# Patient Record
Sex: Male | Born: 1970 | Race: White | Hispanic: No | Marital: Married | Smoking: Never smoker
Health system: Southern US, Community
[De-identification: ages and names within clinical notes are randomized; demographics above are authoritative.]

## PROBLEM LIST (undated history)

## (undated) DIAGNOSIS — M1990 Unspecified osteoarthritis, unspecified site: Secondary | ICD-10-CM

## (undated) DIAGNOSIS — M722 Plantar fascial fibromatosis: Secondary | ICD-10-CM

## (undated) DIAGNOSIS — I10 Essential (primary) hypertension: Secondary | ICD-10-CM

## (undated) DIAGNOSIS — R7303 Prediabetes: Secondary | ICD-10-CM

## (undated) DIAGNOSIS — G709 Myoneural disorder, unspecified: Secondary | ICD-10-CM

## (undated) HISTORY — PX: HIP SURGERY: SHX245

## (undated) HISTORY — PX: COLONOSCOPY: SHX174

## (undated) HISTORY — PX: WISDOM TOOTH EXTRACTION: SHX21

## (undated) HISTORY — DX: Plantar fascial fibromatosis: M72.2

## (undated) HISTORY — PX: CARPAL TUNNEL RELEASE: SHX101

## (undated) HISTORY — PX: SHOULDER DEBRIDEMENT: SHX1052

---

## 2009-05-12 HISTORY — PX: MECKEL DIVERTICULUM EXCISION: SHX314

## 2009-06-08 ENCOUNTER — Ambulatory Visit: Payer: Self-pay | Admitting: Internal Medicine

## 2009-07-09 ENCOUNTER — Ambulatory Visit: Payer: Self-pay | Admitting: Gastroenterology

## 2009-07-13 ENCOUNTER — Ambulatory Visit: Payer: Self-pay | Admitting: Surgery

## 2009-07-17 ENCOUNTER — Ambulatory Visit: Payer: Self-pay | Admitting: Surgery

## 2009-09-21 LAB — HM COLONOSCOPY: HM Colonoscopy: NORMAL

## 2010-05-23 ENCOUNTER — Ambulatory Visit: Payer: Self-pay | Admitting: Dermatology

## 2010-05-24 ENCOUNTER — Ambulatory Visit: Payer: Self-pay | Admitting: Dermatology

## 2011-03-19 IMAGING — CT CT ABD-PELV W/ CM
1 of 2 series · 15 of 32 positions shown, 19 images · non-contrast
Comparison: none

REASON FOR EXAM: LUQ abdominal pain
COMMENTS:

[Series 2: abdomen · axial · 0.79mm/px · z∈[+310,+800]mm · 15 of 108 slices shown, 19 images]
[im 5/108  soft-tissue]
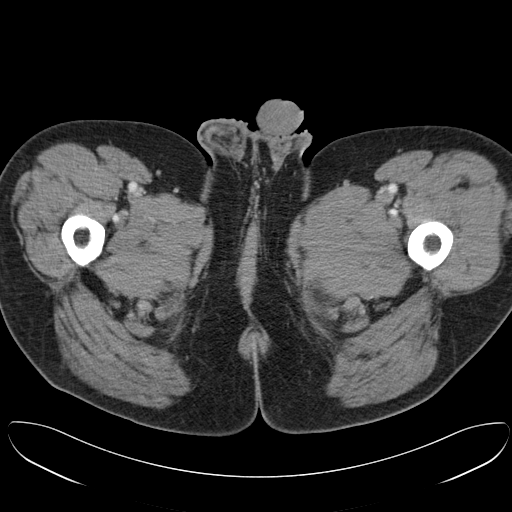
[im 5/108  bone]
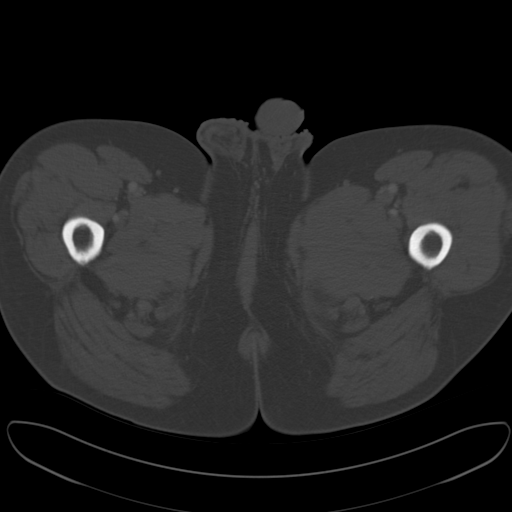
[im 14/108  soft-tissue]
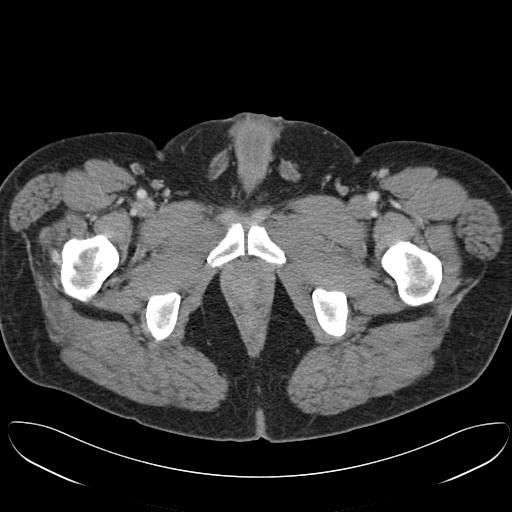
[im 23/108  soft-tissue]
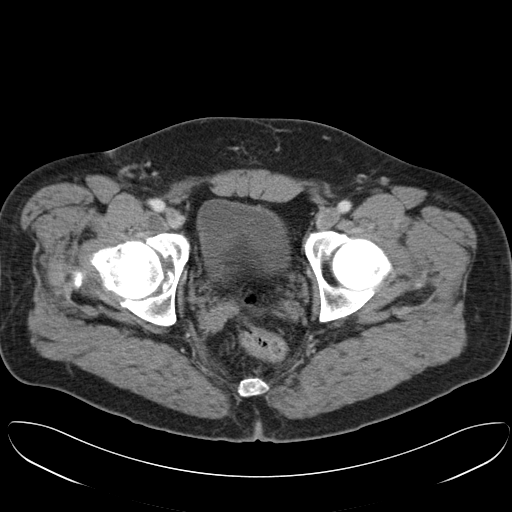
[im 32/108  soft-tissue]
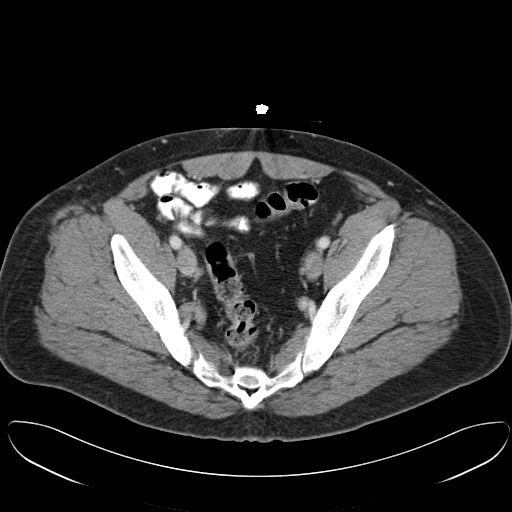
[im 36/108  soft-tissue]
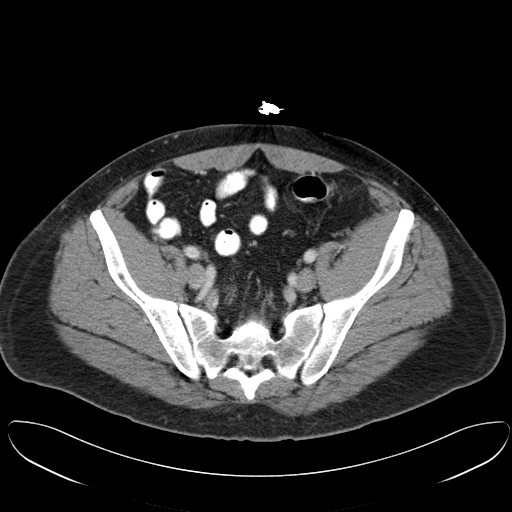
[im 45/108  soft-tissue]
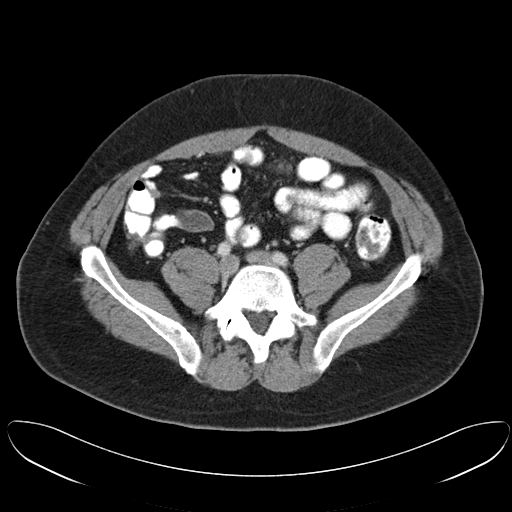
[im 54/108  soft-tissue]
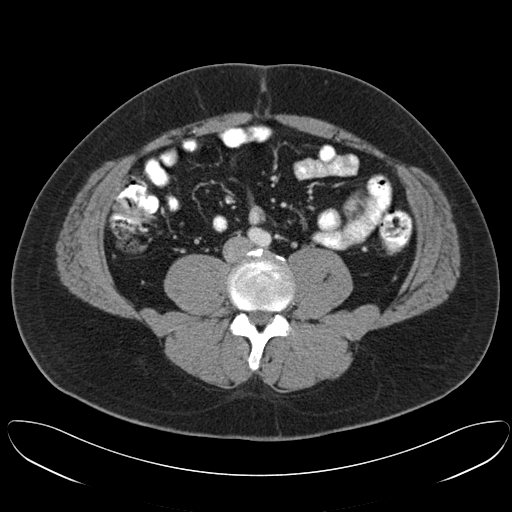
[im 63/108  soft-tissue]
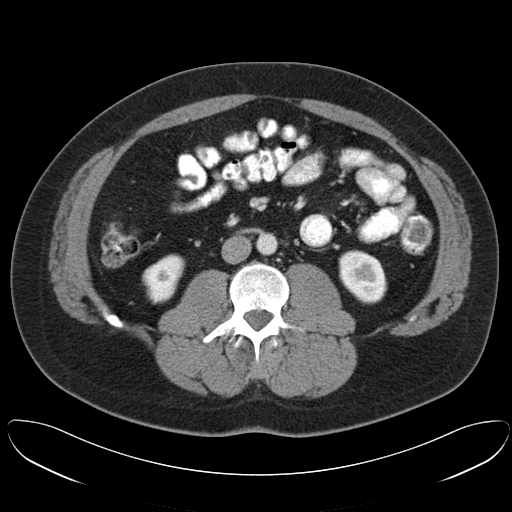
[im 72/108  soft-tissue]
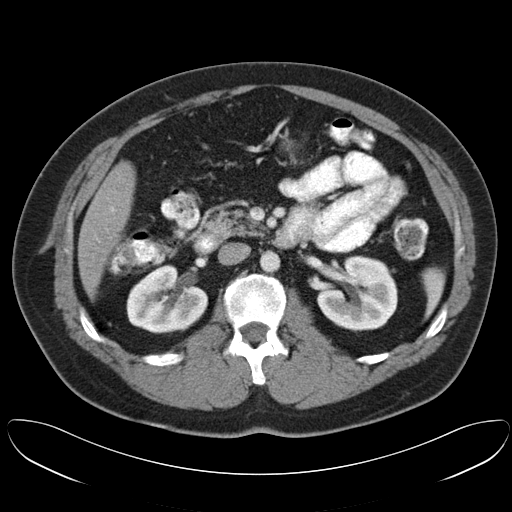
[im 72/108  bone]
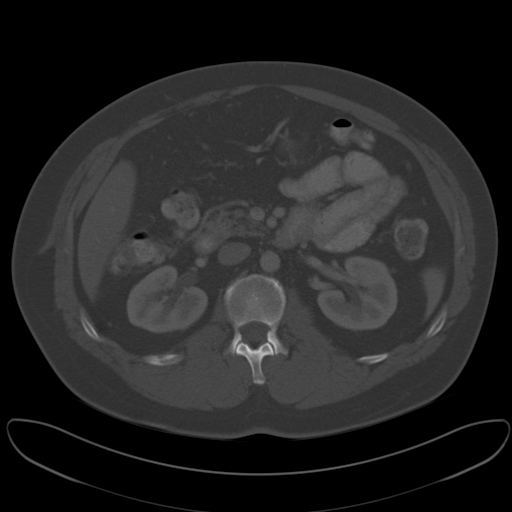
[im 76/108  soft-tissue]
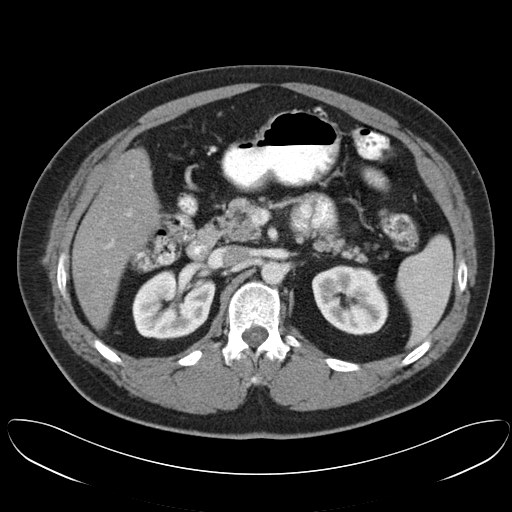
[im 85/108  soft-tissue]
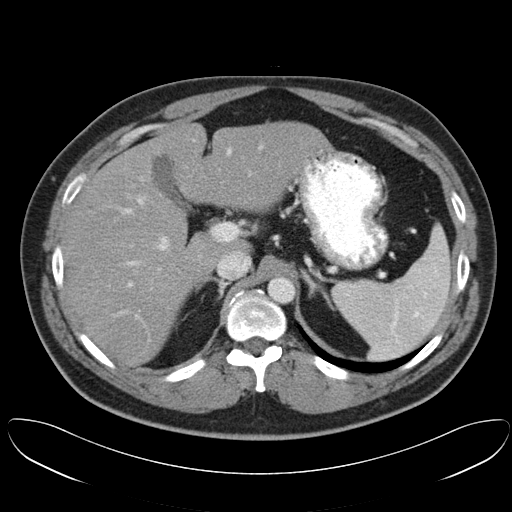
[im 90/108  lung]
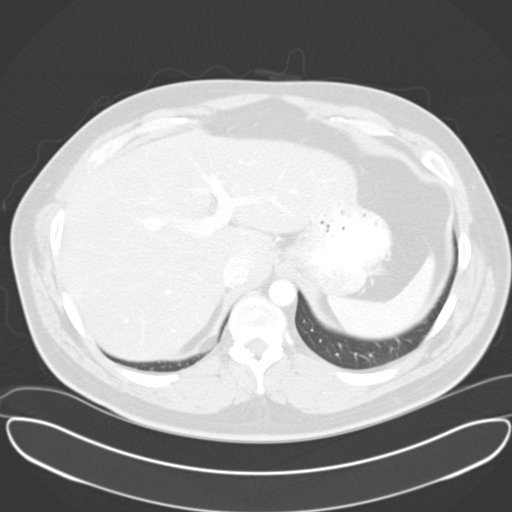
[im 94/108  soft-tissue]
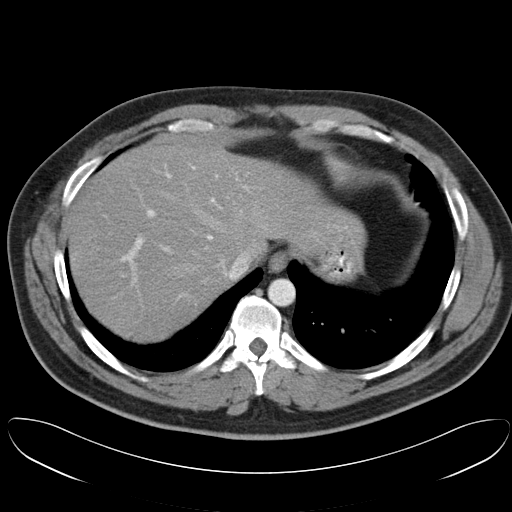
[im 94/108  lung]
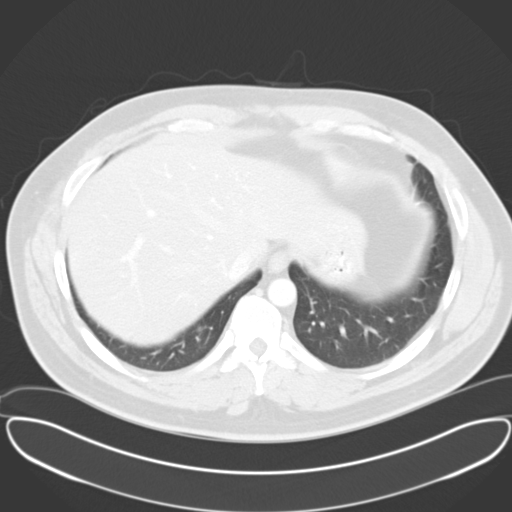
[im 99/108  lung]
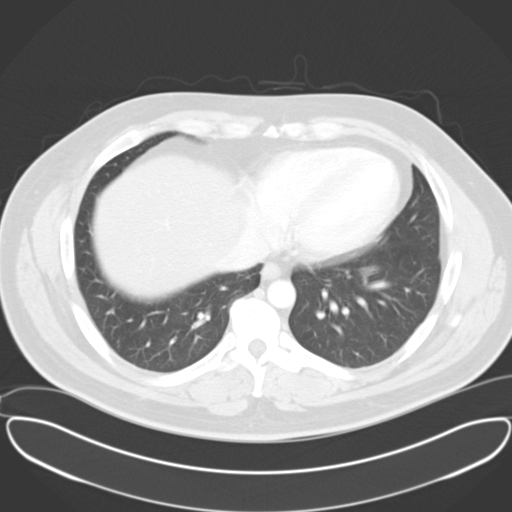
[im 103/108  soft-tissue]
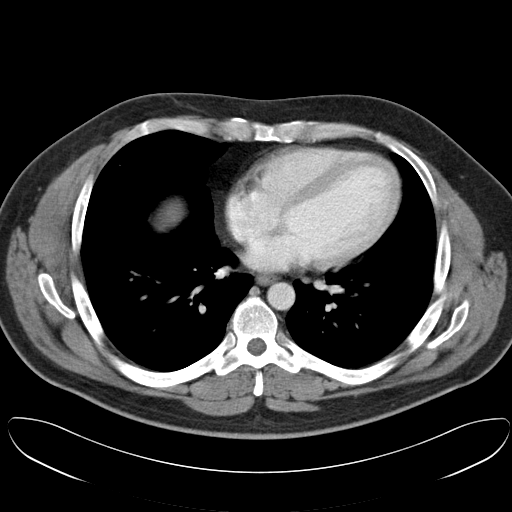
[im 103/108  lung]
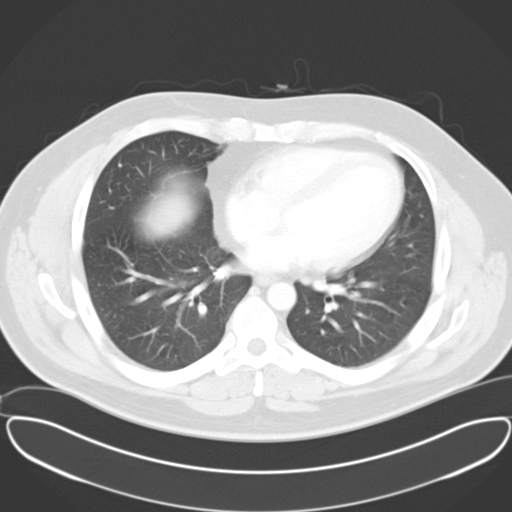

[15 of 32 positions shown; findings below may reference images not displayed]

PROCEDURE:     CT  - CT ABDOMEN / PELVIS  W  - June 08, 2009 [DATE]

RESULT:     CT of the abdomen and pelvis is performed utilizing 100 ml of
Psovue-I1N iodinated intravenous contrast. Images are reconstructed in the
axial plane at 5 mm slice thickness. There is no previous study for
comparison.

Images through the lung bases demonstrate normal aeration. Fatty
infiltration is seen diffusely in the liver. The spleen is normal in
appearance. A small accessory spleen is present. The aorta is normal in
caliber. No acute inflammation is evident. There is no evidence of bowel
wall thickening or abnormal bowel distention. The kidneys enhance
homogeneously. The gallbladder shows no stones or wall thickening. The
adrenal glands are normal. There is no adenopathy. The prostate and urinary
bladder appear unremarkable.

The appendix appears abnormal. The distal portion appears to be distended to
a diameter of 1.7 cm. Along the anterior proximal portion there appears to
be thickening of the wall or possibly a mass. Underlying malignancy is not
excluded. Correlate clinically. Surgical consultation is suggested.
IMPRESSION: 1. Abnormal appearance of the appendix. There appears to be either wall
thickening or mass at the base with a distended appearance of the distal
portion. There is no thickening of the wall of the appendix distally or
evidence of periappendiceal inflammatory stranding to suggest an acute
appendicitis.
2. Fatty infiltration of the liver.
3. Accessory splenic tissue is noted. There is no finding to suggest
constipation or other area of bowel abnormality.

## 2011-08-06 ENCOUNTER — Other Ambulatory Visit: Payer: Self-pay | Admitting: Internal Medicine

## 2011-08-06 MED ORDER — MELOXICAM 15 MG PO TABS: 15.0000 mg | ORAL_TABLET | Freq: Every day | ORAL | Status: DC

## 2011-08-06 NOTE — Telephone Encounter (Signed)
Patient has not been seen in the office.  Please advise.

## 2011-08-06 NOTE — Telephone Encounter (Signed)
If we do not even have records from [facility] on him.  He will need to wait until he is seen or has records transferrred before I can refill for more than 30 days ,. Ok to give #30

## 2011-09-08 ENCOUNTER — Other Ambulatory Visit: Payer: Self-pay | Admitting: Internal Medicine

## 2011-09-22 ENCOUNTER — Ambulatory Visit (INDEPENDENT_AMBULATORY_CARE_PROVIDER_SITE_OTHER): Payer: BC Managed Care – PPO | Admitting: Internal Medicine

## 2011-09-22 ENCOUNTER — Encounter: Payer: Self-pay | Admitting: Internal Medicine

## 2011-09-22 VITALS — BP 148/88 | HR 87 | Temp 97.7°F | Resp 16 | Ht 69.0 in | Wt 231.5 lb

## 2011-09-22 DIAGNOSIS — E669 Obesity, unspecified: Secondary | ICD-10-CM

## 2011-09-22 DIAGNOSIS — R635 Abnormal weight gain: Secondary | ICD-10-CM

## 2011-09-22 DIAGNOSIS — Z13220 Encounter for screening for lipoid disorders: Secondary | ICD-10-CM

## 2011-09-22 DIAGNOSIS — G5603 Carpal tunnel syndrome, bilateral upper limbs: Secondary | ICD-10-CM

## 2011-09-22 DIAGNOSIS — G5600 Carpal tunnel syndrome, unspecified upper limb: Secondary | ICD-10-CM

## 2011-09-22 DIAGNOSIS — Z79899 Other long term (current) drug therapy: Secondary | ICD-10-CM

## 2011-09-22 DIAGNOSIS — M722 Plantar fascial fibromatosis: Secondary | ICD-10-CM

## 2011-09-22 DIAGNOSIS — M1990 Unspecified osteoarthritis, unspecified site: Secondary | ICD-10-CM

## 2011-09-22 DIAGNOSIS — R030 Elevated blood-pressure reading, without diagnosis of hypertension: Secondary | ICD-10-CM

## 2011-09-22 DIAGNOSIS — M129 Arthropathy, unspecified: Secondary | ICD-10-CM

## 2011-09-22 MED ORDER — CELECOXIB 200 MG PO CAPS: 200.0000 mg | ORAL_CAPSULE | Freq: Two times a day (BID) | ORAL | Status: AC

## 2011-09-22 NOTE — Progress Notes (Signed)
Patient ID: Michael Taylor, male   DOB: Sep 24, 1970, 41 y.o.   MRN: [id number] Patient Active Problem List  Diagnoses  . Plantar fasciitis, bilateral  . Elevated blood pressure reading without diagnosis of hypertension  . Obesity (BMI 30-39.9)    Subjective:  CC:   Chief Complaint  Patient presents with  . Follow-up    HPI:   Michael Taylor is a 41 y.o. male who presents followup on arthralgias.  He was last seen over one year ago.  At that time he was reporting bilateral hand numbness which was occurring without neck pain and arm pain.  Was treated with NSAID and wrist braces and symptoms resolved.  Has been using meloxicam for the past year, until his rx wasnot refilled due to loss of follow up.  His Cc today is plantar fasciitis.  He has had bilateral foot pain which was controlled by the meloxicam until about 3 weeks ago when he ran out.  He has multiple other joint complaints intermittently, and recently had  a steroid shot by Reita Chard for rigth wrist pain ,  in the anatomic snuff box. The injection imporved his pain from tendoniti for about a month but was very apinful so he is not plannign to return for a second,  He has no history of GI issues with daily melioxicam.    Past Medical History  Diagnosis Date  . Plantar fasciitis, bilateral     right greater that left,  diagnosed by Dr.  Karl Bales    Past Surgical History  Procedure Date  . Meckel diverticulum excision 2011    Natale Lay  . Shoulder debridement     [facility]  . Hip surgery     right,  [facility]         The following portions of the patient's history were reviewed and updated as appropriate: Allergies, current medications, and problem list.    Review of Systems:   12 Pt  review of systems was negative except those addressed in the HPI,     History   Social History  . Marital Status: Married    Spouse Name: N/A    Number of Children: 3  . Years of Education: N/A    Occupational History  . attorney    Social History Main Topics  . Smoking status: Never Smoker   . Smokeless tobacco: Never Used  . Alcohol Use: 2.4 oz/week    4 Shots of liquor per week     rare  . Drug Use: No  . Sexually Active: Not on file   Other Topics Concern  . Not on file   Social History Narrative  . No narrative on file    Objective:  BP 148/88  Pulse 87  Temp(Src) 97.7 F (36.5 C) (Oral)  Resp 16  Ht 5\' 9"  (1.753 m)  Wt 231 lb 8 oz (105.008 kg)  BMI 34.19 kg/m2  SpO2 95%  General appearance: alert, cooperative and appears stated age Ears: normal TM's and external ear canals both ears Throat: lips, mucosa, and tongue normal; teeth and gums normal Neck: no adenopathy, no carotid bruit, supple, symmetrical, trachea midline and thyroid not enlarged, symmetric, no tenderness/mass/nodules Back: symmetric, no curvature. ROM normal. No CVA tenderness. Lungs: clear to auscultation bilaterally Heart: regular rate and rhythm, S1, S2 normal, no murmur, click, rub or gallop Abdomen: soft, non-tender; bowel sounds normal; no masses,  no organomegaly Pulses: 2+ and symmetric Skin: Skin color, texture, turgor normal. No  rashes or lesions Lymph nodes: Cervical, supraclavicular, and axillary nodes normal.  Assessment and Plan:  Elevated blood pressure reading without diagnosis of hypertension No prior history.  Will have him repet bp checks at home priro to next visit and start therapy if indicated.   Plantar fasciitis, bilateral Trial of celebrex , samples given.   Obesity (BMI 30-39.9) I have addressed  BMI and recommended a low glycemic index diet utilizing smaller more frequent meals to increase metabolism.  I have also recommended that patient start exercising with a goal of 30 minutes of aerobic exercise a minimum of 5 days per week. Screening for lipid disorders, thyroid and diabetes to be done today.     Carpal tunnel syndrome on both sides Symptoms are  mild and do not , by history,  Suggest cervical spine disease as cause. No prior imaging.  Continue NSAIDs for now pending reevaluation of blood pressure,    Updated Medication List Outpatient Encounter Prescriptions as of 09/22/2011  Medication Sig Dispense Refill  . loratadine (CLARITIN) 10 MG tablet Take 10 mg by mouth daily.      . meloxicam (MOBIC) 15 MG tablet Take 1 tablet (15 mg total) by mouth daily.  30 tablet  0  . omeprazole (PRILOSEC) 20 MG capsule Take 20 mg by mouth daily.      . celecoxib (CELEBREX) 200 MG capsule Take 1 capsule (200 mg total) by mouth 2 (two) times daily.  30 capsule  1

## 2011-09-23 ENCOUNTER — Encounter: Payer: Self-pay | Admitting: Internal Medicine

## 2011-09-23 DIAGNOSIS — G5603 Carpal tunnel syndrome, bilateral upper limbs: Secondary | ICD-10-CM | POA: Insufficient documentation

## 2011-09-23 DIAGNOSIS — E669 Obesity, unspecified: Secondary | ICD-10-CM | POA: Insufficient documentation

## 2011-09-23 NOTE — Assessment & Plan Note (Signed)
Symptoms are mild and do not , by history,  Suggest cervical spine disease as cause. No prior imaging.  Continue NSAIDs for now pending reevaluation of blood pressure,

## 2011-09-23 NOTE — Assessment & Plan Note (Signed)
I have addressed  BMI and recommended a low glycemic index diet utilizing smaller more frequent meals to increase metabolism.  I have also recommended that patient start exercising with a goal of 30 minutes of aerobic exercise a minimum of 5 days per week. Screening for lipid disorders, thyroid and diabetes to be done today.   

## 2011-09-23 NOTE — Assessment & Plan Note (Signed)
Trial of celebrex , samples given.

## 2011-09-23 NOTE — Assessment & Plan Note (Signed)
No prior history.  Will have him repet bp checks at home priro to next visit and start therapy if indicated.

## 2011-10-03 ENCOUNTER — Other Ambulatory Visit (INDEPENDENT_AMBULATORY_CARE_PROVIDER_SITE_OTHER): Payer: BC Managed Care – PPO

## 2011-10-03 DIAGNOSIS — M722 Plantar fascial fibromatosis: Secondary | ICD-10-CM

## 2011-10-03 DIAGNOSIS — E669 Obesity, unspecified: Secondary | ICD-10-CM

## 2011-10-03 DIAGNOSIS — R030 Elevated blood-pressure reading, without diagnosis of hypertension: Secondary | ICD-10-CM

## 2011-10-03 DIAGNOSIS — R635 Abnormal weight gain: Secondary | ICD-10-CM

## 2011-10-03 DIAGNOSIS — G5600 Carpal tunnel syndrome, unspecified upper limb: Secondary | ICD-10-CM

## 2011-10-03 DIAGNOSIS — M1990 Unspecified osteoarthritis, unspecified site: Secondary | ICD-10-CM

## 2011-10-03 DIAGNOSIS — Z79899 Other long term (current) drug therapy: Secondary | ICD-10-CM

## 2011-10-03 DIAGNOSIS — G5603 Carpal tunnel syndrome, bilateral upper limbs: Secondary | ICD-10-CM

## 2011-10-03 DIAGNOSIS — M129 Arthropathy, unspecified: Secondary | ICD-10-CM

## 2011-10-03 DIAGNOSIS — Z13220 Encounter for screening for lipoid disorders: Secondary | ICD-10-CM

## 2011-10-03 LAB — COMPREHENSIVE METABOLIC PANEL
CO2: 30 mEq/L (ref 19–32)
Calcium: 8.9 mg/dL (ref 8.4–10.5)
Chloride: 105 mEq/L (ref 96–112)
GFR: 94.25 mL/min (ref >60.00)
Glucose, Bld: 104 mg/dL — ABNORMAL HIGH (ref 70–99)
Sodium: 142 mEq/L (ref 135–145)
Total Bilirubin: 0.7 mg/dL (ref 0.3–1.2)
Total Protein: 7.4 g/dL (ref 6.0–8.3)

## 2011-10-03 LAB — LDL CHOLESTEROL, DIRECT: Direct LDL: 92.2 mg/dL

## 2011-10-03 LAB — LIPID PANEL
HDL: 44.1 mg/dL (ref >39.00)
Triglycerides: 263 mg/dL — ABNORMAL HIGH (ref 0.0–149.0)

## 2011-10-03 NOTE — Progress Notes (Signed)
Addended by: Baldomero Lamy on: 10/03/2011 08:25 AM   Modules accepted: Orders

## 2012-03-02 IMAGING — CR RIGHT INDEX FINGER 2+V
1 series · 3 of 3 positions shown · non-contrast
Comparison: none

REASON FOR EXAM: pain in joint - fax results to [phone]
COMMENTS:

PROCEDURE:     MDR - MDR FINGER INDEX 2ND DIG RT HA  - May 23, 2010  [DATE]
RESULT:     Images of the right index finger demonstrate no fracture,
dislocation or radiopaque foreign body.

[Series 1: view not recorded · 0.17mm/px · 3 of 3 slices shown]
[im 1/3]
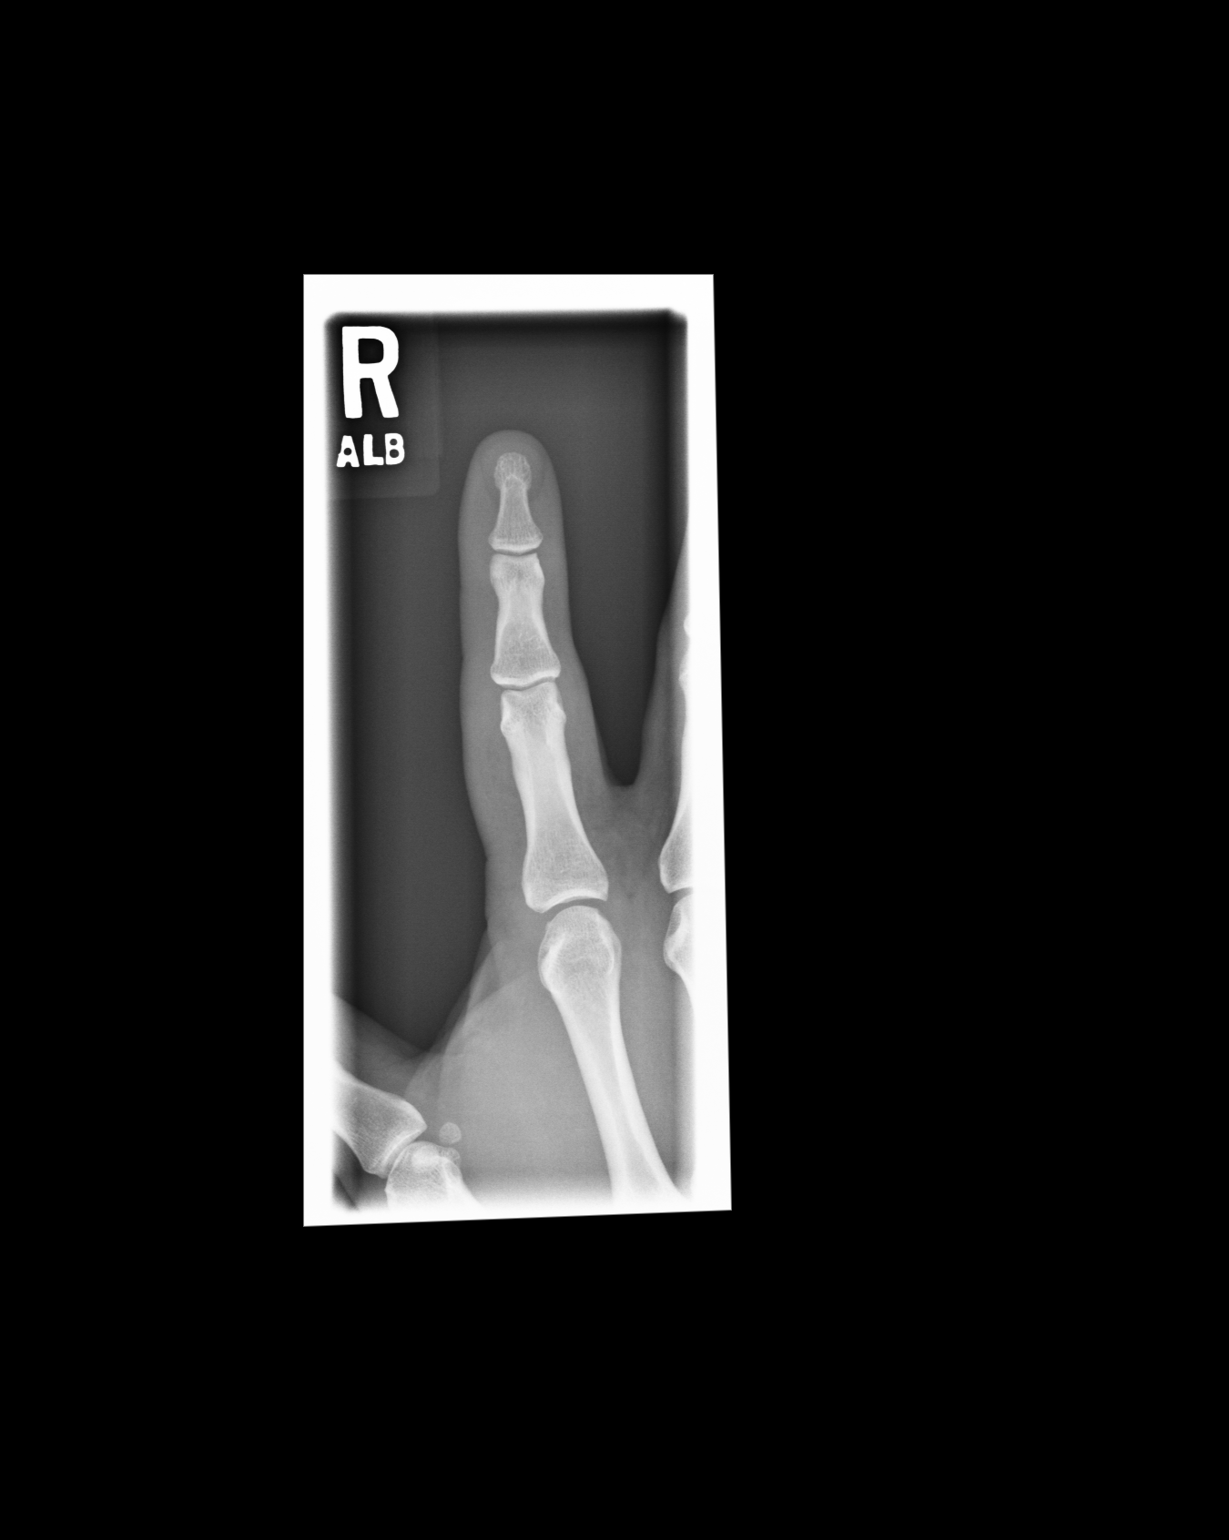
[im 2/3]
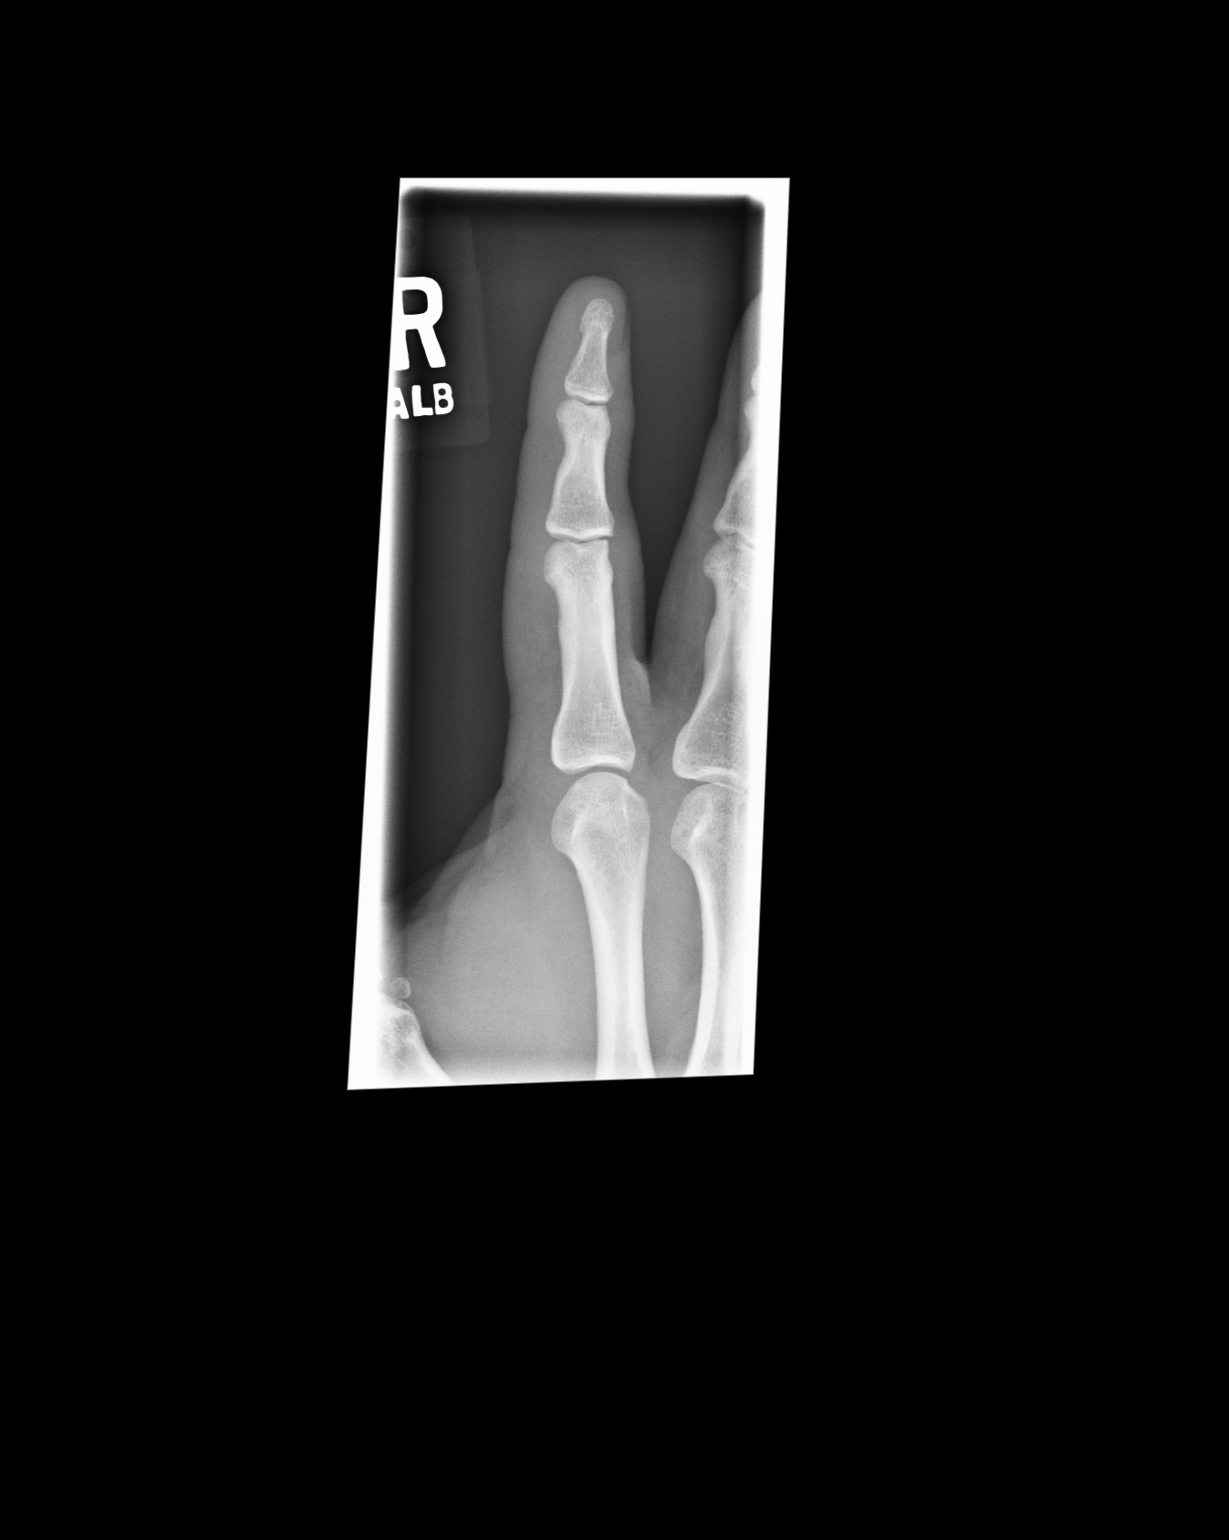
[im 3/3]
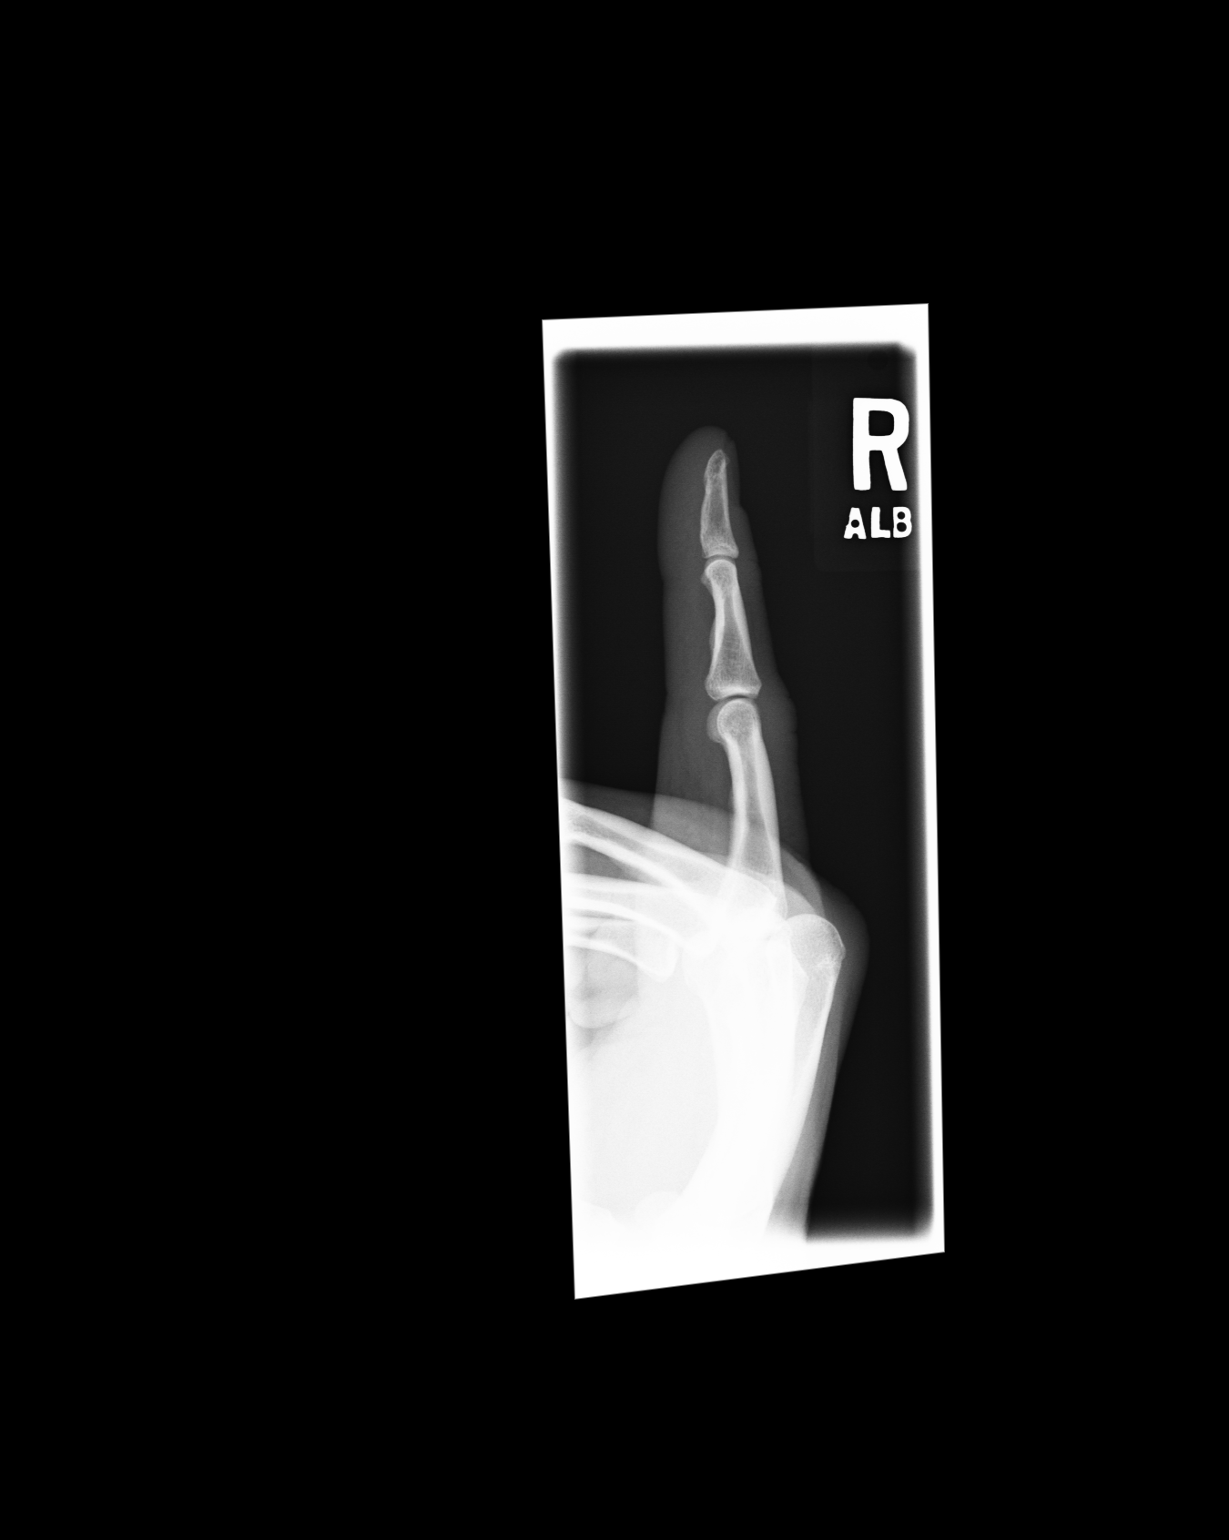

[3 of 3 positions shown; findings below may reference images not displayed]

IMPRESSION: Please see above.

## 2012-05-26 ENCOUNTER — Encounter: Payer: Self-pay | Admitting: Internal Medicine

## 2012-05-26 ENCOUNTER — Ambulatory Visit (INDEPENDENT_AMBULATORY_CARE_PROVIDER_SITE_OTHER): Payer: No Typology Code available for payment source | Admitting: Internal Medicine

## 2012-05-26 VITALS — BP 136/90 | HR 96 | Temp 98.3°F | Resp 16 | Wt 241.0 lb

## 2012-05-26 DIAGNOSIS — G5600 Carpal tunnel syndrome, unspecified upper limb: Secondary | ICD-10-CM

## 2012-05-26 DIAGNOSIS — G609 Hereditary and idiopathic neuropathy, unspecified: Secondary | ICD-10-CM

## 2012-05-26 DIAGNOSIS — D171 Benign lipomatous neoplasm of skin and subcutaneous tissue of trunk: Secondary | ICD-10-CM

## 2012-05-26 DIAGNOSIS — G629 Polyneuropathy, unspecified: Secondary | ICD-10-CM

## 2012-05-26 DIAGNOSIS — E669 Obesity, unspecified: Secondary | ICD-10-CM

## 2012-05-26 DIAGNOSIS — D1739 Benign lipomatous neoplasm of skin and subcutaneous tissue of other sites: Secondary | ICD-10-CM

## 2012-05-26 DIAGNOSIS — G5603 Carpal tunnel syndrome, bilateral upper limbs: Secondary | ICD-10-CM

## 2012-05-26 MED ORDER — MELOXICAM 15 MG PO TABS: 15.0000 mg | ORAL_TABLET | Freq: Every day | ORAL | Status: AC

## 2012-05-26 MED ORDER — GABAPENTIN 100 MG PO CAPS: 100.0000 mg | ORAL_CAPSULE | Freq: Three times a day (TID) | ORAL | Status: DC

## 2012-05-26 NOTE — Patient Instructions (Addendum)
I am testing you for carpal tunnel syndrome and other causes of peripheral neuropathy  24 hr urine collection for heavy metals  Blood work today   EMG/nerve conduction studies to be set up at [facility]   Take the meloxicam (nonsteroidal anti inflammatory) once daily. 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

## 2012-05-26 NOTE — Progress Notes (Signed)
Patient ID: Michael Taylor, male   DOB: Dec 20, 1970, 42 y.o.   MRN: [id number]  Patient Active Problem List  Diagnosis  . Plantar fasciitis, bilateral  . Blood pressure elevated without history of HTN  . Obesity (BMI 30-39.9)  . Carpal tunnel syndrome on both sides  . Lipoma of abdominal wall    Subjective:  CC:   Chief Complaint  Patient presents with  . Numbness in hands    HPI:   Drayson Dorko a 42 y.o. male who presents with Bilateral numbness in both hands,  limited dexteritiy,  Hands throbbing at night, .  No neck pain, but tried chiropractiy for a few weeks with no change. history of steroid shot in right anatomic snuff box by [employer].  Resolved that pain .  Affecting the 2nd and 3rd finger.  pins and needles.    History of right biceps muscle rupture, self healed.  Left clavicle fracture,  2 shoulder surgeries for separation [facility] .  Want any surgery to be done at [facility].  Enlarging fatty tumor right side.   Past Medical History  Diagnosis Date  . Plantar fasciitis, bilateral     right greater that left,  diagnosed by Dr.  Karl Bales    Past Surgical History  Procedure Date  . Meckel diverticulum excision 2011    Natale Lay  . Shoulder debridement     [facility]  . Hip surgery     right,  [facility]         The following portions of the patient's history were reviewed and updated as appropriate: Allergies, current medications, and problem list.    Review of Systems:   12 Pt  review of systems was negative except those addressed in the HPI,     History   Social History  . Marital Status: Married    Spouse Name: N/A    Number of Children: 3  . Years of Education: N/A   Occupational History  . attorney    Social History Main Topics  . Smoking status: Never Smoker   . Smokeless tobacco: Never Used  . Alcohol Use: 2.4 oz/week    4 Shots of liquor per week     Comment: rare  . Drug Use: No  .  Sexually Active: Not on file   Other Topics Concern  . Not on file   Social History Narrative  . No narrative on file    Objective:  BP 136/90  Pulse 96  Temp 98.3 F (36.8 C) (Oral)  Resp 16  Wt 241 lb (109.317 kg)  SpO2 96%  General appearance: alert, cooperative and appears stated age Ears: normal TM's and external ear canals both ears Throat: lips, mucosa, and tongue normal; teeth and gums normal Neck: no adenopathy, no carotid bruit, supple, symmetrical, trachea midline and thyroid not enlarged, symmetric, no tenderness/mass/nodules Back: symmetric, no curvature. ROM normal. No CVA tenderness. Lungs: clear to auscultation bilaterally Heart: regular rate and rhythm, S1, S2 normal, no murmur, click, rub or gallop Abdomen: soft, non-tender; bowel sounds normal; no masses,  no organomegaly Pulses: 2+ and symmetric Skin: Skin color, texture, turgor normal. No rashes or lesions Lymph nodes: Cervical, supraclavicular, and axillary nodes normal.  Assessment and Plan:  Carpal tunnel syndrome on both sides Ruling out reversible causes, followed by EMG/nerve conduction studies .  trial of gabapentin  Obesity (BMI 30-39.9) I have addressed  BMI and recommended a low glycemic index diet utilizing smaller more frequent  meals to increase metabolism.  I have also recommended that patient start exercising with a goal of 30 minutes of aerobic exercise a minimum of 5 days per week. Screening for lipid disorders, thyroid and diabetes to be done today.    Lipoma of abdominal wall Surgical referral when patient agrees.    Updated Medication List Outpatient Encounter Prescriptions as of 05/26/2012  Medication Sig Dispense Refill  . omeprazole (PRILOSEC) 20 MG capsule Take 20 mg by mouth daily.      Marland Kitchen gabapentin (NEURONTIN) 100 MG capsule Take 1 capsule (100 mg total) by mouth 3 (three) times daily.  90 capsule  3  . loratadine (CLARITIN) 10 MG tablet Take 10 mg by mouth daily.      .  meloxicam (MOBIC) 15 MG tablet Take 1 tablet (15 mg total) by mouth daily.  30 tablet  3  . [DISCONTINUED] meloxicam (MOBIC) 15 MG tablet Take 1 tablet (15 mg total) by mouth daily.  30 tablet  0     Orders Placed This Encounter  Procedures  . RPR  . Vitamin B12  . Heavy metals screen, urine  . NCV with EMG(electromyography)    No Follow-up on file.

## 2012-05-28 ENCOUNTER — Encounter: Payer: Self-pay | Admitting: Internal Medicine

## 2012-05-28 DIAGNOSIS — D171 Benign lipomatous neoplasm of skin and subcutaneous tissue of trunk: Secondary | ICD-10-CM | POA: Insufficient documentation

## 2012-05-28 NOTE — Assessment & Plan Note (Signed)
Surgical referral when patient agrees.

## 2012-05-28 NOTE — Assessment & Plan Note (Signed)
Ruling out reversible causes, followed by EMG/nerve conduction studies .  trial of gabapentin

## 2012-05-28 NOTE — Assessment & Plan Note (Signed)
I have addressed  BMI and recommended a low glycemic index diet utilizing smaller more frequent meals to increase metabolism.  I have also recommended that patient start exercising with a goal of 30 minutes of aerobic exercise a minimum of 5 days per week. Screening for lipid disorders, thyroid and diabetes to be done today.   

## 2012-06-14 LAB — HEAVY METALS SCREEN, URINE: Lead, Urine (24 Hr): <1 mcg/L (ref <80)

## 2013-07-15 ENCOUNTER — Ambulatory Visit: Payer: Self-pay | Admitting: Specialist

## 2014-09-02 NOTE — Op Note (Signed)
PATIENT NAME:  Michael Taylor, KELTNER MR#:  [id number] DATE OF BIRTH:  Sep 13, 1970  DATE OF PROCEDURE:  07/15/2013  PREOPERATIVE DIAGNOSIS: Bilateral carpal tunnel syndrome.   POSTOPERATIVE DIAGNOSIS: Bilateral carpal tunnel syndrome.   PROCEDURES: 1.  Right carpal tunnel release.  2.  Left carpal tunnel release.   SURGEON: Christophe Louis, M.D.   ANESTHESIA: General.   COMPLICATIONS: None.  TOURNIQUET TIME:  Approximately 15 minutes on the right and 10 minutes on the left.   DESCRIPTION OF PROCEDURE: After adequate induction of general anesthesia, both upper extremities were thoroughly prepped with alcohol and ChloraPrep and draped in standard sterile fashion. Identical procedures are performed on each side. The extremity is wrapped out with the Esmarch bandage and pneumatic tourniquet elevated to 250 mmHg. On the right side the Esmarch was used as a tourniquet about the forearm, as there was an IV line in the antecubital fossa. On both sides, standard volar carpal tunnel incision is made. The dissection is carried down to the transverse retinacular ligament under loupe magnification. The ligament is incised in the midportion with the knife. The distal release is performed with the small scissors. The proximal release is performed with the small scissors and the carpal tunnel scissors. Careful check is made both proximally and distally to ensure that complete release had been obtained. There is seen to be moderate to severe compression of the nerve on each side beneath the ligament. No mass lesions are palpated. The wound is thoroughly irrigated multiple times. Skin edges are infiltrated with 0.5% plain Marcaine. The skin is closed with 4-0 nylon. A soft bulky dressing is applied. The tourniquet is released. The patient is returned to the recovery room in satisfactory condition having tolerated the procedure quite well.   ____________________________ Lucas Mallow,  MD ces:ce D: 07/15/2013 13:12:11 ET T: 07/15/2013 17:38:08 ET JOB#: [id number]  cc: Lucas Mallow, MD, <Dictator> Lucas Mallow MD ELECTRONICALLY SIGNED 07/19/2013 9:09

## 2015-01-19 ENCOUNTER — Encounter: Payer: Self-pay | Admitting: Family Medicine

## 2015-01-19 ENCOUNTER — Ambulatory Visit (INDEPENDENT_AMBULATORY_CARE_PROVIDER_SITE_OTHER): Payer: BC Managed Care – PPO | Admitting: Family Medicine

## 2015-01-19 VITALS — BP 150/95 | HR 83 | Temp 98.0°F | Resp 16 | Ht 69.0 in | Wt 239.0 lb

## 2015-01-19 DIAGNOSIS — Z23 Encounter for immunization: Secondary | ICD-10-CM | POA: Diagnosis not present

## 2015-01-19 DIAGNOSIS — Z8042 Family history of malignant neoplasm of prostate: Secondary | ICD-10-CM

## 2015-01-19 DIAGNOSIS — Z125 Encounter for screening for malignant neoplasm of prostate: Secondary | ICD-10-CM

## 2015-01-19 DIAGNOSIS — I10 Essential (primary) hypertension: Secondary | ICD-10-CM | POA: Diagnosis not present

## 2015-01-19 MED ORDER — LISINOPRIL 5 MG PO TABS: 5.0000 mg | ORAL_TABLET | Freq: Every day | ORAL | Status: DC

## 2015-01-19 NOTE — Patient Instructions (Signed)
Discussed weight loss. 

## 2015-01-19 NOTE — Progress Notes (Signed)
Name: Michael Taylor   MRN: [id number]    DOB: 1970-09-01   Date:01/19/2015       Progress Note  Subjective  Chief Complaint  Chief Complaint  Patient presents with  . Hypertension    Pt states BP was 161/100 this weekend. He wants to get checked out.    HPI Had BP elevation to 160/100 over weekend.   Has had some borderline to sl. High readings in the past.  Pos. Family hx of HBP.  Also pos. FH of prostate cancer.  Wants PSA .   No problem-specific assessment & plan notes found for this encounter.   Past Medical History  Diagnosis Date  . Plantar fasciitis, bilateral     right greater that left,  diagnosed by Dr.  Claris Che    Social History  Substance Use Topics  . Smoking status: Never Smoker   . Smokeless tobacco: Never Used  . Alcohol Use: 2.4 oz/week    4 Shots of liquor per week     Comment: rare     Current outpatient prescriptions:  .  omeprazole (PRILOSEC) 20 MG capsule, Take 20 mg by mouth daily., Disp: , Rfl:   No Known Allergies  Review of Systems  Constitutional: Negative for fever, chills, weight loss and malaise/fatigue.  HENT: Negative for hearing loss.   Eyes: Positive for blurred vision. Negative for double vision.  Respiratory: Negative for cough, sputum production, shortness of breath and wheezing.   Cardiovascular: Negative for chest pain, palpitations, orthopnea and leg swelling.  Gastrointestinal: Negative for heartburn, nausea, vomiting, abdominal pain, diarrhea and blood in stool.  Genitourinary: Positive for urgency. Negative for dysuria and frequency.  Musculoskeletal: Negative for myalgias and joint pain.  Skin: Negative for rash.  Neurological: Negative for dizziness, sensory change, focal weakness, weakness and headaches.  Psychiatric/Behavioral: Negative for depression. The patient is not nervous/anxious.       Objective  Filed Vitals:   01/19/15 1119  BP: 138/91  Pulse: 83  Temp: 98 F (36.7 C)  TempSrc: Oral  Resp: 16   Height: 5\' 9"  (1.753 m)  Weight: 239 lb (108.41 kg)     Physical Exam  Constitutional: He is oriented to person, place, and time and well-developed, well-nourished, and in no distress. No distress.  HENT:  Head: Normocephalic and atraumatic.  Eyes: Conjunctivae and EOM are normal. Pupils are equal, round, and reactive to light. No scleral icterus.  Neck: Normal range of motion. Neck supple. No thyromegaly present.  Cardiovascular: Normal rate, regular rhythm, normal heart sounds and intact distal pulses.   Pulmonary/Chest: Effort normal and breath sounds normal. No respiratory distress. He has no wheezes. He has no rales.  Abdominal: Soft. Bowel sounds are normal. He exhibits no distension, no abdominal bruit and no mass. There is no tenderness.  Musculoskeletal: He exhibits no edema.  Lymphadenopathy:    He has no cervical adenopathy.  Neurological: He is alert and oriented to person, place, and time.  Vitals reviewed.     No results found for this or any previous visit (from the past 2160 hour(s)).   Assessment & Plan  1. Essential hypertension  - Lipid Profile - Comprehensive Metabolic Panel (CMET)  2. Prostate cancer screening   3. Family history of prostate cancer  - CBC with Differential - PSA

## 2015-02-19 ENCOUNTER — Ambulatory Visit: Payer: BC Managed Care – PPO | Admitting: Family Medicine

## 2015-02-20 ENCOUNTER — Encounter: Payer: Self-pay | Admitting: Family Medicine

## 2015-02-20 ENCOUNTER — Ambulatory Visit (INDEPENDENT_AMBULATORY_CARE_PROVIDER_SITE_OTHER): Payer: BC Managed Care – PPO | Admitting: Family Medicine

## 2015-02-20 VITALS — BP 125/80 | HR 100 | Resp 16 | Ht 69.0 in | Wt 234.0 lb

## 2015-02-20 DIAGNOSIS — I10 Essential (primary) hypertension: Secondary | ICD-10-CM | POA: Insufficient documentation

## 2015-02-20 DIAGNOSIS — E669 Obesity, unspecified: Secondary | ICD-10-CM

## 2015-02-20 DIAGNOSIS — K219 Gastro-esophageal reflux disease without esophagitis: Secondary | ICD-10-CM | POA: Diagnosis not present

## 2015-02-20 DIAGNOSIS — Z125 Encounter for screening for malignant neoplasm of prostate: Secondary | ICD-10-CM

## 2015-02-20 DIAGNOSIS — Z8042 Family history of malignant neoplasm of prostate: Secondary | ICD-10-CM

## 2015-02-20 MED ORDER — OMEPRAZOLE 20 MG PO CPDR: 20.0000 mg | DELAYED_RELEASE_CAPSULE | Freq: Every day | ORAL | Status: DC

## 2015-02-20 MED ORDER — LISINOPRIL 5 MG PO TABS: 5.0000 mg | ORAL_TABLET | Freq: Every day | ORAL | Status: DC

## 2015-02-20 NOTE — Progress Notes (Signed)
Name: Michael Taylor   MRN: [id number]    DOB: 1971-02-01   Date:02/20/2015       Progress Note  Subjective  Chief Complaint  Chief Complaint  Patient presents with  . Hypertension    HPI Here for f/u of HBP.  Feeling well.  Taking meds.  No problem-specific assessment & plan notes found for this encounter.   Past Medical History  Diagnosis Date  . Plantar fasciitis, bilateral     right greater that left,  diagnosed by Dr.  Claris Che    Social History  Substance Use Topics  . Smoking status: Never Smoker   . Smokeless tobacco: Never Used  . Alcohol Use: 2.4 oz/week    4 Shots of liquor per week     Comment: rare     Current outpatient prescriptions:  .  lisinopril (PRINIVIL,ZESTRIL) 5 MG tablet, Take 1 tablet (5 mg total) by mouth daily., Disp: 30 tablet, Rfl: 6 .  omeprazole (PRILOSEC) 20 MG capsule, Take 20 mg by mouth daily., Disp: , Rfl:   No Known Allergies  Review of Systems  Constitutional: Negative for fever, chills, weight loss and malaise/fatigue.  HENT: Negative for hearing loss.   Eyes: Negative for blurred vision and double vision.  Respiratory: Negative for cough, shortness of breath and wheezing.   Cardiovascular: Negative for chest pain, palpitations and leg swelling.  Gastrointestinal: Negative for heartburn, abdominal pain and blood in stool.  Genitourinary: Negative for dysuria, urgency and frequency.  Musculoskeletal: Negative for myalgias and joint pain.  Neurological: Negative for weakness and headaches.      Objective  There were no vitals filed for this visit.   Physical Exam  Constitutional: He is oriented to person, place, and time and well-developed, well-nourished, and in no distress. No distress.  HENT:  Head: Normocephalic and atraumatic.  Eyes: Conjunctivae and EOM are normal. Pupils are equal, round, and reactive to light. No scleral icterus.  Neck: Normal range of motion. Neck supple. No thyromegaly present.   Cardiovascular: Normal rate, regular rhythm, normal heart sounds and intact distal pulses.  Exam reveals no gallop and no friction rub.   No murmur heard. Pulmonary/Chest: Effort normal and breath sounds normal. No respiratory distress. He has no wheezes. He has no rales.  Musculoskeletal: He exhibits no edema.  Lymphadenopathy:    He has no cervical adenopathy.  Neurological: He is alert and oriented to person, place, and time.  Vitals reviewed.     No results found for this or any previous visit (from the past 2160 hour(s)).   Assessment and plan  1. Essential hypertension  - Comprehensive Metabolic Panel (CMET) - Lipid Profile - lisinopril (PRINIVIL,ZESTRIL) 5 MG tablet; Take 1 tablet (5 mg total) by mouth daily.  Dispense: 90 tablet; Refill: 3  2. Family history of prostate cancer  - PSA  3. Prostate cancer screening  - CBC with Differential  4. Obesity (BMI 30-39.9)   5. Gastroesophageal reflux disease without esophagitis  - omeprazole (PRILOSEC) 20 MG capsule; Take 1 capsule (20 mg total) by mouth daily.  Dispense: 90 capsule; Refill: 3

## 2015-04-23 ENCOUNTER — Encounter: Payer: Self-pay | Admitting: Emergency Medicine

## 2015-04-23 ENCOUNTER — Ambulatory Visit
Admission: EM | Admit: 2015-04-23 | Discharge: 2015-04-23 | Disposition: A | Discharge status: Home / Self Care | Payer: BC Managed Care – PPO | Attending: Family Medicine | Admitting: Family Medicine

## 2015-04-23 DIAGNOSIS — J029 Acute pharyngitis, unspecified: Secondary | ICD-10-CM

## 2015-04-23 DIAGNOSIS — B349 Viral infection, unspecified: Secondary | ICD-10-CM | POA: Diagnosis not present

## 2015-04-23 HISTORY — DX: Essential (primary) hypertension: I10

## 2015-04-23 LAB — RAPID STREP SCREEN (MED CTR MEBANE ONLY): Streptococcus, Group A Screen (Direct): NEGATIVE

## 2015-04-23 LAB — RAPID INFLUENZA A&B ANTIGENS: Influenza A (ARMC): NOT DETECTED

## 2015-04-23 LAB — RAPID INFLUENZA A&B ANTIGENS (ARMC ONLY): INFLUENZA B (ARMC): NOT DETECTED

## 2015-04-23 MED ORDER — LIDOCAINE VISCOUS 2 % MT SOLN: OROMUCOSAL | Status: DC

## 2015-04-23 MED ORDER — AZITHROMYCIN 250 MG PO TABS: ORAL_TABLET | ORAL | Status: DC

## 2015-04-23 MED ORDER — ACETAMINOPHEN 500 MG PO TABS
1000.0000 mg | ORAL_TABLET | Freq: Once | ORAL | Status: AC
  Administered 2015-04-23: 1000 mg via ORAL

## 2015-04-23 MED ORDER — OSELTAMIVIR PHOSPHATE 75 MG PO CAPS: 75.0000 mg | ORAL_CAPSULE | Freq: Two times a day (BID) | ORAL | Status: DC

## 2015-04-23 NOTE — ED Notes (Signed)
Patient c/o HAs, fever, and bodyaches since yesterday.

## 2015-04-23 NOTE — ED Provider Notes (Signed)
CSN: [id number]     Arrival date & time 04/23/15  [id number] History   First MD Initiated Contact with Patient 04/23/15 0840     Chief Complaint  Patient presents with  . Fever  . Headache   (Consider location/radiation/quality/duration/timing/severity/associated sxs/prior Treatment) HPI Comments: 44 yo male, generally otherwise healthy, presents with a complaint of headaches, fevers of 101-102, bodyaches and sore throat since yesterday. Complains of pain with swallowing. Denies any cough, shortness of breath, wheezing, or chest pain.   The history is provided by the patient.    Past Medical History  Diagnosis Date  . Plantar fasciitis, bilateral     right greater that left,  diagnosed by Dr.  Claris Che  . Hypertension    Past Surgical History  Procedure Laterality Date  . Meckel diverticulum excision  2011    [name]  . Shoulder debridement      [facility]  . Hip surgery      right,  [facility] graft   Family History  Problem Relation Age of Onset  . Arthritis Mother   . Hypertension Mother   . Arthritis Father     knee replacement  . Hypertension Father   . Heart disease Father 86  . Cancer Neg Hx    Social History  Substance Use Topics  . Smoking status: Never Smoker   . Smokeless tobacco: Never Used  . Alcohol Use: 2.4 oz/week    4 Shots of liquor per week     Comment: rare    Review of Systems  Allergies  Review of patient's allergies indicates no known allergies.  Home Medications   Prior to Admission medications   Medication Sig Start Date End Date Taking? Authorizing Provider  azithromycin (ZITHROMAX Z-PAK) 250 MG tablet 2 tabs po once day 1, then 1 tab po qd for next 4 days 04/23/15   Norval Gable, MD  lidocaine (XYLOCAINE) 2 % solution 10ml gargle and spit q 6 hours prn sore throat 04/23/15   Norval Gable, MD  lisinopril (PRINIVIL,ZESTRIL) 5 MG tablet Take 1 tablet (5 mg total) by mouth daily. 02/20/15   Arlis Porta., MD  omeprazole  (PRILOSEC) 20 MG capsule Take 1 capsule (20 mg total) by mouth daily. 02/20/15   Arlis Porta., MD  oseltamivir (TAMIFLU) 75 MG capsule Take 1 capsule (75 mg total) by mouth 2 (two) times daily. 04/23/15   Norval Gable, MD   Meds Ordered and Administered this Visit   Medications  acetaminophen (TYLENOL) tablet 1,000 mg (1,000 mg Oral Given 04/23/15 0839)    BP 161/94 mmHg  Pulse 124  Temp(Src) 101.6 F (38.7 C) (Tympanic)  Resp 16  Ht 5\' 9"  (1.753 m)  Wt 230 lb (104.327 kg)  BMI 33.95 kg/m2  SpO2 98% No data found.   Physical Exam  Constitutional: He appears well-developed and well-nourished. No distress.  HENT:  Head: Normocephalic and atraumatic.  Right Ear: Tympanic membrane, external ear and ear canal normal.  Left Ear: Tympanic membrane, external ear and ear canal normal.  Nose: Nose normal.  Mouth/Throat: Uvula is midline and mucous membranes are normal. Oropharyngeal exudate and posterior oropharyngeal erythema present. No posterior oropharyngeal edema or tonsillar abscesses.  Eyes: Conjunctivae and EOM are normal. Pupils are equal, round, and reactive to light. Right eye exhibits no discharge. Left eye exhibits no discharge. No scleral icterus.  Neck: Normal range of motion. Neck supple. No tracheal deviation present. No thyromegaly present.  Cardiovascular: Normal rate,  regular rhythm and normal heart sounds.   Pulmonary/Chest: Effort normal and breath sounds normal. No stridor. No respiratory distress. He has no wheezes. He has no rales. He exhibits no tenderness.  Lymphadenopathy:    He has no cervical adenopathy.  Neurological: He is alert.  Skin: Skin is warm and dry. No rash noted. He is not diaphoretic.  Nursing note and vitals reviewed.   ED Course  Procedures (including critical care time)  Labs Review Labs Reviewed  RAPID INFLUENZA A&B ANTIGENS ([facility] ONLY)  RAPID STREP SCREEN (NOT AT [facility])  CULTURE, GROUP A STREP ([facility] ONLY)    Imaging  Review No results found.   Visual Acuity Review  Right Eye Distance:   Left Eye Distance:   Bilateral Distance:    Right Eye Near:   Left Eye Near:    Bilateral Near:         MDM   1. Viral syndrome   2. Pharyngitis     Discharge Medication List as of 04/23/2015  9:07 AM    START taking these medications   Details  azithromycin (ZITHROMAX Z-PAK) 250 MG tablet 2 tabs po once day 1, then 1 tab po qd for next 4 days, Normal    lidocaine (XYLOCAINE) 2 % solution 58ml gargle and spit q 6 hours prn sore throat, Normal    oseltamivir (TAMIFLU) 75 MG capsule Take 1 capsule (75 mg total) by mouth 2 (two) times daily., Starting 04/23/2015, Until Discontinued, Normal       1. Lab results and diagnosis reviewed with patient  2. rx as per orders above; reviewed possible side effects, interactions, risks and benefits   3. Recommend supportive treatment with rest, increased fluids, otc analgesics  4. Follow-up prn if symptoms worsen or don't improve  Norval Gable, MD 04/23/15 1254

## 2015-04-25 LAB — CULTURE, GROUP A STREP (THRC)

## 2015-04-26 DIAGNOSIS — D239 Other benign neoplasm of skin, unspecified: Secondary | ICD-10-CM

## 2015-04-26 HISTORY — DX: Other benign neoplasm of skin, unspecified: D23.9

## 2016-12-03 ENCOUNTER — Other Ambulatory Visit: Payer: Self-pay | Admitting: Family Medicine

## 2016-12-03 ENCOUNTER — Encounter: Payer: Self-pay | Admitting: Family Medicine

## 2016-12-03 ENCOUNTER — Ambulatory Visit (INDEPENDENT_AMBULATORY_CARE_PROVIDER_SITE_OTHER): Payer: BC Managed Care – PPO | Admitting: Family Medicine

## 2016-12-03 VITALS — BP 153/93 | HR 87 | Temp 98.4°F | Ht 69.0 in | Wt 259.6 lb

## 2016-12-03 DIAGNOSIS — I10 Essential (primary) hypertension: Secondary | ICD-10-CM

## 2016-12-03 DIAGNOSIS — E669 Obesity, unspecified: Secondary | ICD-10-CM | POA: Diagnosis not present

## 2016-12-03 DIAGNOSIS — M79671 Pain in right foot: Secondary | ICD-10-CM | POA: Diagnosis not present

## 2016-12-03 DIAGNOSIS — M2141 Flat foot [pes planus] (acquired), right foot: Secondary | ICD-10-CM | POA: Diagnosis not present

## 2016-12-03 DIAGNOSIS — Z125 Encounter for screening for malignant neoplasm of prostate: Secondary | ICD-10-CM

## 2016-12-03 DIAGNOSIS — R635 Abnormal weight gain: Secondary | ICD-10-CM

## 2016-12-03 DIAGNOSIS — K219 Gastro-esophageal reflux disease without esophagitis: Secondary | ICD-10-CM | POA: Insufficient documentation

## 2016-12-03 DIAGNOSIS — R7309 Other abnormal glucose: Secondary | ICD-10-CM

## 2016-12-03 DIAGNOSIS — Z0000 Encounter for general adult medical examination without abnormal findings: Secondary | ICD-10-CM

## 2016-12-03 DIAGNOSIS — E782 Mixed hyperlipidemia: Secondary | ICD-10-CM

## 2016-12-03 DIAGNOSIS — Z114 Encounter for screening for human immunodeficiency virus [HIV]: Secondary | ICD-10-CM

## 2016-12-03 DIAGNOSIS — M2142 Flat foot [pes planus] (acquired), left foot: Secondary | ICD-10-CM | POA: Diagnosis not present

## 2016-12-03 DIAGNOSIS — Z8042 Family history of malignant neoplasm of prostate: Secondary | ICD-10-CM

## 2016-12-03 LAB — BASIC METABOLIC PANEL WITH GFR
BUN: 10 mg/dL (ref 7–25)
CALCIUM: 9 mg/dL (ref 8.6–10.3)
CO2: 25 mmol/L (ref 20–31)
CREATININE: 0.94 mg/dL (ref 0.60–1.35)
Chloride: 103 mmol/L (ref 98–110)
GFR, Est African American: >89 mL/min (ref >=60)
GLUCOSE: 93 mg/dL (ref 65–99)
Potassium: 4 mmol/L (ref 3.5–5.3)
Sodium: 138 mmol/L (ref 135–146)

## 2016-12-03 NOTE — Assessment & Plan Note (Signed)
Uncontrolled HTN, off med >1.5 years and limited lifestyle interventions, lost to follow-up - Home BP readings none (rare readings at pharmacy elevated), can consider getting a cuff  No known complications - last labs 2013, awaiting results   Plan:  1. Since off meds now, was only on short course Lisinopril 5mg  - will check BMET today in office, based on Na/K/Cr will likely initiate new start ARB with Losartan 50mg  tabs - may start with cut in half for 25mg  for 1-2 weeks, measure BP, then likely increase to full pill 50mg  if needed 2. Encourage improved lifestyle - low sodium diet, regular exercise 3. Start monitor BP outside office, bring readings to next visit, if persistently >140/90 or new symptoms notify office sooner 4. Follow-up 3 weeks for repeat labs for baseline, annual physical, and repeat Cr for new ARB start

## 2016-12-03 NOTE — Patient Instructions (Addendum)
Thank you for coming to the clinic today.  1.  Blood work today, non fasting, will check kidney, and sodium / potassium, will call you or send message through MyChart with results and how to proceed with medicine likely Losartan once daily, similar med from your previous  Check BP occasionally on new med, few times week is fine, and write down readings, bring to next visit  You most likely have Plantar Fasciitis of heel / foot. - This is inflammation of the fibrous connection on the bottom of the foot, and can have small micro tears over time that become painful. It usually will have flare ups lasting days to weeks, and may come back after it heals if it is re-aggravated again. - Often there is a bone spur or arthritis of the heel bone that causes this - Also it may be caused by abnormal footwear, walking pattern or other problems  If you are experiencing an acute flare with pain, this is usually worst first thing in the morning when the plantar fascia is tight and stiff. First step out of bed is painful usually, and it may gradually improve with stretching and walking.  Recommend trial of Anti-inflammatory with OTC Naproxen or Aleve 220-250mg  - take one to two tabs with food and plenty of water TWICE daily every day (breakfast and dinner), for 1-2 weeks then you may take only as needed - DO NOT TAKE any ibuprofen, meloxicam motrin while you are taking this medicine - It is safe to take Tylenol Ext Str 500mg  tabs - take 1 to 2 (max dose 1000mg ) every 6 hours as needed for breakthrough pain, max 24 hour daily dose is 6 to 8 tablets or 4000mg   Recommend: - Rest / relative rest with activity modification avoid overuse / prolonged stand - Ice packs (make sure you use a towel or sock / something to protect skin)  May also try topical muscle rub, icy hot, tiger balm  Start the exercises listed below, gradually increase them as instructed. May be sore at first and hopefully will stretch out and help  reduce pain later.  If not improving after 1-2 weeks on medicine, and stretching exercises and some rest - then can contact our office again for re-evaluation may consider other causes or can refer you to Orthopedic specialist to have second opinion, and consider a steroid injection.  You will be due for FASTING BLOOD WORK (no food or drink after midnight before visit, only water or coffee without cream/sugar on the morning of)   - Please go ahead and schedule a "Lab Only" visit in the morning at the clinic for lab draw in [place]   before next Annual Physical - Make sure Lab Only appointment is at least 1-2 weeks before your next appointment, so that results will be available  For Lab Results, once available within 2-3 days of blood draw, you can can log in to MyChart online to view your results and a brief explanation. Also, we can discuss results at next follow-up visit.   Please schedule a Follow-up Appointment to: Return in about 3 weeks (around 12/24/2016) for Annual Physical.  If you have any other questions or concerns, please feel free to call the clinic or send a message through [place]. You may also schedule an earlier appointment if necessary.  Additionally, you may be receiving a survey about your experience at our clinic within a few days to 1 week by e-mail or mail. We value your feedback.  [name], DO [facility]              Plantar Fascia Stretches / Exercises  See other page with pictures of each exercise.  Start with 1 or 2 of these exercises that you are most comfortable with. Do not do any exercises that cause you significant worsening pain. Some of these may cause some "stretching soreness" but it should go away after you stop the exercise, and get better over time. Gradually increase up to 3-4 exercises as tolerated.  You may begin exercising the muscles of your foot right away by gently stretching them as  follows:  Stretching: Towel stretch: Sit on a hard surface with your injured leg stretched out in front of you. Loop a towel around the ball of your foot and pull the towel toward your body keeping your knee straight. Hold this position for 15 to 30 seconds then relax. Repeat 3 times. When the towel stretch becomes to easy, you may begin doing the standing calf stretch.  Standing calf stretch: Facing a wall, put your hands against the wall at about eye level. Keep the injured leg back, the uninjured leg forward, and the heel of your injured leg on the floor. Turn your injured foot slightly inward (as if you were pigeon-toed) as you slowly lean into the wall until you feel a stretch in the back of your calf. Hold for 15 to 30 seconds. Repeat 3 times. Do this exercise several times each day. When you can stand comfortably on your injured foot, you can begin stretching the bottom of your foot using the plantar fascia stretch.  Plantar fascia stretch: Stand with the ball of your injured foot on a stair. Reach for the bottom step with your heel until you feel a stretch in the arch of your foot. Hold this position for 15 to 30 seconds and then relax. Repeat 3 times. After you have stretched the bottom muscles of your foot, you can begin strengthening the top muscles of your foot.  Frozen can roll: Roll your bare injured foot back and forth from your heel to your mid-arch over a frozen juice can. Repeat for 3 to 5 minutes. This exercise is particularly helpful if done first thing in the morning. Towel pickup: With your heel on the ground, pick up a towel with your toes. Release. Repeat 10 to 20 times. When this gets easy, add more resistance by placing a book or small weight on the towel. Static and dynamic balance exercises Place a chair next to your non-injured leg and stand upright. (This will provide you with balance if needed.) Stand on your injured foot. Try to raise the arch of your foot while keeping  your toes on the floor. Try to maintain this position and balance on your injured side for 30 seconds. This exercise can be made more difficult by doing it on a piece of foam or a pillow, or with your eyes closed. Stand in the same position as above. Keep your foot in this position and reach forward in front of you with your injured side's hand, allowing your knee to bend. Repeat this 10 times while maintaining the arch height. This exercise can be made more difficult by reaching farther in front of you. Do 2 sets. Stand in the same position as above. While maintaining your arch height, reach the injured side's hand across your body toward the chair. The farther you reach, the more challenging the exercise. Do  2 sets of 10.  Next, you can begin strengthening the muscles of your foot and lower leg by using elastic tubing.  Strengthening: Resisted dorsiflexion: Sit with your injured leg out straight and your foot facing a doorway. Tie a loop in one end of the tubing. Put your foot through the loop so that the tubing goes around the arch of your foot. Tie a knot in the other end of the tubing and shut the knot in the door. Move backward until there is tension in the tubing. Keeping your knee straight, pull your foot toward your body, stretching the tubing. Slowly return to the starting position. Do 3 sets of 10. Resisted plantar flexion: Sit with your leg outstretched and loop the middle section of the tubing around the ball of your foot. Hold the ends of the tubing in both hands. Gently press the ball of your foot down and point your toes, stretching the tubing. Return to the starting position. Do 3 sets of 10. Resisted inversion: Sit with your legs out straight and cross your uninjured leg over your injured ankle. Wrap the tubing around the ball of your injured foot and then loop it around your uninjured foot so that the tubing is anchored there at one end. Hold the other end of the tubing in your hand. Turn  your injured foot inward and upward. This will stretch the tubing. Return to the starting position. Do 3 sets of 10. Resisted eversion: Sit with both legs stretched out in front of you, with your feet about a shoulder's width apart. Tie a loop in one end of the tubing. Put your injured foot through the loop so that the tubing goes around the arch of that foot and wraps around the outside of the uninjured foot. Hold onto the other end of the tubing with your hand to provide tension. Turn your injured foot up and out. Make sure you keep your uninjured foot still so that it will allow the tubing to stretch as you move your injured foot. Return to the starting position. Do 3 sets of 10.

## 2016-12-03 NOTE — Progress Notes (Addendum)
Subjective:    Patient ID: Michael Taylor, male    DOB: 11-Mar-1971, 46 y.o.   MRN: [id number]  Michael Taylor is a 46 y.o. male presenting on 12/03/2016 for Hypertension (pt been off bp medication x 1 yr. pt also concern about prespiration. Sweating more then usual. )   HPI   CHRONIC HTN: Reports he was followed by prior PCP Dr Luan Pulling, last visit 02/2015, he was managed on meds for HTN, and then eventually he states he just did not follow-up and was off meds for past >1 year. Recently had problem with "felt like BP may be getting high again" he checked at pharmacy and elevated reading, does not recall exactly >160/90, but then this improved. He attributes some of it to life stressor. Regardless, he is back to re-establish care and resume HTN medicine and evaluation. Current Meds - None for past >1 year (previously on Lisinopril 5mg  daily for few months, tolerated well, but just stopped taking it), no other meds tried. Lifestyle: - Diet: Admits poor diet with variety of foods, does not limit carbs, could drink more water - Exercise: limited to no regular exercise currently, previously due to L plantar fascia, see below now has R plantar fascia or heel pain limiting him Denies CP, dyspnea, HA, edema, dizziness / lightheadedness  Bilateral Plantar Fasciitis, R Heel Pain / Pes Planus - Reports prior history of L plantar fasciitis with heel pain, he tried to maximize conservative therapy, established with [facility] and treated there, had exercises, injection and boot, however he accidentally had foot injury and "tore" his plantar fascia which actually healed it once it resolved, due to tight fascia, this was several years ago and he has done well with L foot since. He has flat feet bilateral. - Now with new complaint x 2 months with R foot heel pain seems to not be improving but not worsening, worse with weight bearing or over activity, especially if barefoot, attributes weight, he has tried  conservative care for plantar fascia at home, frozen can rolls and stretches only temporary relief, has not returned to Podiatry - In past tried Meloxicam for other foot, he considered similar med, has not tried [employer] regularly - Denies new foot injury, trauma, fall, swelling, redness  OBESITY BMI >38 - Reports chronic problem with weight worsening recently over past 1-2 years. Admits wt gain +19 lbs in >1.5 years. Attributed to poor lifestyle, stress among other factors. He understands needs to improve lifestyle - Also family history of heart disease, Father with heart disease history, MI in age 69s to 1s - Additional complaint episodes of increased sweating at times, usually stress related.  GERD: Not focus of visit, briefly patient reports taking OTC Omeprazole 20mg  daily for past 20 years, tolerating well.  Depression screen [facility] 2/9 12/04/2016 01/19/2015  Decreased Interest 0 0  Down, Depressed, Hopeless 0 0  PHQ - 2 Score 0 0     Social History  Substance Use Topics  . Smoking status: Never Smoker  . Smokeless tobacco: Never Used  . Alcohol use 2.4 oz/week    4 Shots of liquor per week     Comment: rare, < 1 x monthly    Review of Systems Per HPI unless specifically indicated above     Objective:    BP (!) 153/93 (BP Location: Right Arm, Patient Position: Sitting, Cuff Size: Large)   Pulse 87   Temp 98.4 F (36.9 C) (Oral)   Ht 5\' 9"  (1.753  m)   Wt 259 lb 9.6 oz (117.8 kg)   BMI 38.34 kg/m   Wt Readings from Last 3 Encounters:  12/03/16 259 lb 9.6 oz (117.8 kg)  04/23/15 230 lb (104.3 kg)  02/20/15 234 lb (106.1 kg)    Physical Exam  Constitutional: He is oriented to person, place, and time. He appears well-developed and well-nourished. No distress.  Well-appearing, comfortable, cooperative, obese  HENT:  Head: Normocephalic and atraumatic.  Mouth/Throat: Oropharynx is clear and moist.  Eyes: Conjunctivae are normal. Right eye exhibits no discharge. Left eye  exhibits no discharge.  Cardiovascular: Normal rate, regular rhythm, normal heart sounds and intact distal pulses.   No murmur heard. Pulmonary/Chest: Effort normal.  Musculoskeletal: He exhibits no edema.  Neurological: He is alert and oriented to person, place, and time.  Skin: Skin is warm and dry. No rash noted. He is not diaphoretic. No erythema.  Psychiatric: He has a normal mood and affect. His behavior is normal.  Well groomed, good eye contact, normal speech and thoughts  Nursing note and vitals reviewed.      Assessment & Plan:   Problem List Items Addressed This Visit    Pain of right heel    Clinically consistent with subacute on chronic R plantar fasciitis based on history, exam deferred today to next visit since new BP evaluation. - No known injury. Similar course L heel plantar fasciitis - Previously followed by [facility] - Not responding to conservative therapy  Plan: 1. Reviewed diagnosis and management of plantar fasciitis 2. Start rx NSAID trial - OTC Aleve / Naproxen 250 to 500mg  BID wc x 1-2 weeks then PRN 3. May take Tylenol PRN breakthrough 4. May use topical muscle rub, ice 5. Emphasized importance of relative rest, ice, and avoid prolonged standing and overuse 6. Handout given and explained appropriate stretching and home exercises important to do first thing in morning and later 7. Follow-up 4 weeks if not improved, consider he may schedule back to [facility] for re-eval may need custom orthotic, injection vs bracing      Obesity (BMI 30-39.9)    Concern with worsening wt gain over 1.5 years up to 19 lbs At risk with HTN, prior elevated TG, and history of abnormal glucose, unknown current lab status, will check labs in 3 weeks fasting for physical Emphasized importance of diet / exercise lifestyle      GERD (gastroesophageal reflux disease)    Stable, chronic problem Controlled on PPI OTC omeprazole 20mg  daily Follow-up monitor progress consider  future EGD if needed vs taper off gradually      Essential hypertension - Primary    Uncontrolled HTN, off med >1.5 years and limited lifestyle interventions, lost to follow-up - Home BP readings none (rare readings at pharmacy elevated), can consider getting a cuff  No known complications - last labs 2013, awaiting results   Plan:  1. Since off meds now, was only on short course Lisinopril 5mg  - will check BMET today in office, based on Na/K/Cr will likely initiate new start ARB with Losartan 50mg  tabs - may start with cut in half for 25mg  for 1-2 weeks, measure BP, then likely increase to full pill 50mg  if needed 2. Encourage improved lifestyle - low sodium diet, regular exercise 3. Start monitor BP outside office, bring readings to next visit, if persistently >140/90 or new symptoms notify office sooner 4. Follow-up 3 weeks for repeat labs for baseline, annual physical, and repeat Cr for new ARB start  Relevant Medications   losartan (COZAAR) 50 MG tablet   Other Relevant Orders   BASIC METABOLIC PANEL WITH GFR (Completed)   Bilateral pes planus    Likely factor for chronic heel pain, now worse R>L, plantar fascia Follow-up with [facility] likely needs custom orthotic Weight loss          **UPDATE reviewed BMET, normal K, Cr, - sent new rx Losartan 50mg  tabs - take half for 1-2 weeks 25mg  then if BP >135/90 on avg can increase to whole pill 50mg  daily**  Meds ordered this encounter  Medications  . losartan (COZAAR) 50 MG tablet    Sig: Take half pill (25mg ) daily for 1-2 weeks, if BP consistently >135/90, increase to whole pill (50mg ) daily.    Dispense:  30 tablet    Refill:  2    Follow up plan: Return in about 3 weeks (around 12/24/2016) for fasting labs, then 1 week for Annual Physical.  [name], DO [facility] 12/04/2016, 12:07 AM

## 2016-12-03 NOTE — Assessment & Plan Note (Signed)
Stable, chronic problem Controlled on PPI OTC omeprazole 20mg  daily Follow-up monitor progress consider future EGD if needed vs taper off gradually

## 2016-12-03 NOTE — Assessment & Plan Note (Signed)
Clinically consistent with subacute on chronic R plantar fasciitis based on history, exam deferred today to next visit since new BP evaluation. - No known injury. Similar course L heel plantar fasciitis - Previously followed by [facility] - Not responding to conservative therapy  Plan: 1. Reviewed diagnosis and management of plantar fasciitis 2. Start rx NSAID trial - OTC Aleve / Naproxen 250 to 500mg  BID wc x 1-2 weeks then PRN 3. May take Tylenol PRN breakthrough 4. May use topical muscle rub, ice 5. Emphasized importance of relative rest, ice, and avoid prolonged standing and overuse 6. Handout given and explained appropriate stretching and home exercises important to do first thing in morning and later 7. Follow-up 4 weeks if not improved, consider he may schedule back to [facility] for re-eval may need custom orthotic, injection vs bracing

## 2016-12-03 NOTE — Assessment & Plan Note (Signed)
Concern with worsening wt gain over 1.5 years up to 19 lbs At risk with HTN, prior elevated TG, and history of abnormal glucose, unknown current lab status, will check labs in 3 weeks fasting for physical Emphasized importance of diet / exercise lifestyle

## 2016-12-03 NOTE — Assessment & Plan Note (Signed)
Likely factor for chronic heel pain, now worse R>L, plantar fascia Follow-up with [facility] likely needs custom orthotic Weight loss

## 2016-12-04 MED ORDER — LOSARTAN POTASSIUM 50 MG PO TABS: ORAL_TABLET | ORAL | 2 refills | Status: DC

## 2016-12-04 NOTE — Addendum Note (Signed)
Addended by: Olin Hauser on: 12/04/2016 12:08 AM   Modules accepted: Orders

## 2016-12-24 ENCOUNTER — Other Ambulatory Visit: Payer: BC Managed Care – PPO

## 2016-12-24 DIAGNOSIS — Z114 Encounter for screening for human immunodeficiency virus [HIV]: Secondary | ICD-10-CM

## 2016-12-24 DIAGNOSIS — Z0000 Encounter for general adult medical examination without abnormal findings: Secondary | ICD-10-CM

## 2016-12-24 DIAGNOSIS — I10 Essential (primary) hypertension: Secondary | ICD-10-CM

## 2016-12-24 DIAGNOSIS — R635 Abnormal weight gain: Secondary | ICD-10-CM

## 2016-12-24 DIAGNOSIS — Z125 Encounter for screening for malignant neoplasm of prostate: Secondary | ICD-10-CM

## 2016-12-24 DIAGNOSIS — R7309 Other abnormal glucose: Secondary | ICD-10-CM

## 2016-12-24 DIAGNOSIS — E669 Obesity, unspecified: Secondary | ICD-10-CM

## 2016-12-24 DIAGNOSIS — Z8042 Family history of malignant neoplasm of prostate: Secondary | ICD-10-CM

## 2016-12-24 DIAGNOSIS — E782 Mixed hyperlipidemia: Secondary | ICD-10-CM

## 2016-12-24 LAB — CBC WITH DIFFERENTIAL/PLATELET
BASOS ABS: 52 {cells}/uL (ref 0–200)
Basophils Relative: 1 %
EOS ABS: 156 {cells}/uL (ref 15–500)
EOS PCT: 3 %
HCT: 46.2 % (ref 38.5–50.0)
HEMOGLOBIN: 15.5 g/dL (ref 13.2–17.1)
LYMPHS ABS: 1508 {cells}/uL (ref 850–3900)
Lymphocytes Relative: 29 %
MCH: 28.5 pg (ref 27.0–33.0)
MCHC: 33.5 g/dL (ref 32.0–36.0)
MCV: 85.1 fL (ref 80.0–100.0)
MPV: 10.2 fL (ref 7.5–12.5)
Monocytes Absolute: 468 cells/uL (ref 200–950)
Monocytes Relative: 9 %
NEUTROS PCT: 58 %
Neutro Abs: 3016 cells/uL (ref 1500–7800)
Platelets: 230 10*3/uL (ref 140–400)
RBC: 5.43 MIL/uL (ref 4.20–5.80)
RDW: 15.1 % — ABNORMAL HIGH (ref 11.0–15.0)
WBC: 5.2 10*3/uL (ref 3.8–10.8)

## 2016-12-25 LAB — COMPLETE METABOLIC PANEL WITH GFR
ALT: 37 U/L (ref 9–46)
AST: 25 U/L (ref 10–40)
Albumin: 4.1 g/dL (ref 3.6–5.1)
Alkaline Phosphatase: 72 U/L (ref 40–115)
BILIRUBIN TOTAL: 0.6 mg/dL (ref 0.2–1.2)
BUN: 12 mg/dL (ref 7–25)
CHLORIDE: 103 mmol/L (ref 98–110)
CO2: 27 mmol/L (ref 20–32)
Calcium: 9.1 mg/dL (ref 8.6–10.3)
Creat: 0.94 mg/dL (ref 0.60–1.35)
Glucose, Bld: 111 mg/dL — ABNORMAL HIGH (ref 65–99)
Potassium: 3.9 mmol/L (ref 3.5–5.3)
Sodium: 140 mmol/L (ref 135–146)
Total Protein: 6.8 g/dL (ref 6.1–8.1)

## 2016-12-25 LAB — HEMOGLOBIN A1C
HEMOGLOBIN A1C: 5.8 % — AB (ref <5.7)
Mean Plasma Glucose: 120 mg/dL

## 2016-12-25 LAB — LIPID PANEL
Cholesterol: 148 mg/dL (ref <200)
HDL: 43 mg/dL (ref >40)
LDL CALC: 78 mg/dL (ref <100)
TRIGLYCERIDES: 137 mg/dL (ref <150)
Total CHOL/HDL Ratio: 3.4 Ratio (ref <5.0)
VLDL: 27 mg/dL (ref <30)

## 2016-12-25 LAB — HIV ANTIBODY (ROUTINE TESTING W REFLEX): HIV 1&2 Ab, 4th Generation: NONREACTIVE

## 2016-12-25 LAB — TSH: TSH: 1.3 m[IU]/L (ref 0.40–4.50)

## 2016-12-25 LAB — PSA, TOTAL WITH REFLEX TO PSA, FREE: PSA, Total: 0.7 ng/mL (ref <=4.0)

## 2016-12-31 ENCOUNTER — Encounter: Payer: Self-pay | Admitting: Family Medicine

## 2016-12-31 ENCOUNTER — Ambulatory Visit (INDEPENDENT_AMBULATORY_CARE_PROVIDER_SITE_OTHER): Payer: BC Managed Care – PPO | Admitting: Family Medicine

## 2016-12-31 VITALS — BP 145/89 | HR 90 | Temp 98.2°F | Resp 16 | Ht 69.0 in | Wt 259.0 lb

## 2016-12-31 DIAGNOSIS — M722 Plantar fascial fibromatosis: Secondary | ICD-10-CM

## 2016-12-31 DIAGNOSIS — E669 Obesity, unspecified: Secondary | ICD-10-CM

## 2016-12-31 DIAGNOSIS — R7303 Prediabetes: Secondary | ICD-10-CM

## 2016-12-31 DIAGNOSIS — E782 Mixed hyperlipidemia: Secondary | ICD-10-CM

## 2016-12-31 DIAGNOSIS — I10 Essential (primary) hypertension: Secondary | ICD-10-CM

## 2016-12-31 DIAGNOSIS — Z0000 Encounter for general adult medical examination without abnormal findings: Secondary | ICD-10-CM | POA: Diagnosis not present

## 2016-12-31 NOTE — Assessment & Plan Note (Signed)
Concern with worsening wt gain over 1.5 years up to 19 lbs At risk with HTN, PreDM Reassuring with improved lipids Again emphasized importance of diet / exercise lifestyle changes to help control blood sugar, BP, and reduce risk in future

## 2016-12-31 NOTE — Assessment & Plan Note (Signed)
Controlled cholesterol, despite poor lifestyle. Not on statin. Last lipid panel 12/2016 Calculated ASCVD 10 yr risk score 2-4% (based on HTN)  Plan: 1. Reviewed ASCVD risk - for now he is at appropriate risk level, no indication for ASA / Statin currently 2. Encourage improved lifestyle - low carb/cholesterol, reduce portion size, start regular exercise once foot pain improved 3. Follow-up 4 months wt/lifestyle check, lipids q 1 yr

## 2016-12-31 NOTE — Assessment & Plan Note (Signed)
Improved BP control on low dose ARB, limited lifestyle changes - Home BP readings none (rare readings at pharmacy elevated) - Stable labs in interval on new ARB No known complications. At risk with PreDM   Plan:  1. INCREASE dose Losartan from 25mg  (half tab) now to 50mg  daily (WHOLE tab) - no new rx sent 2. Encourage improved lifestyle - low sodium diet also low carb vs DASH diet, regular exercise (if plantar fascia improved) 3. Start monitor BP outside office more regularly, bring readings to next visit, if persistently >135/85 or new symptoms notify office sooner 4. Follow-up 4 weeks with outside BP log report, may adjust Losartan to 1.5 tabs dose = 75mg  vs 100mg , otherwise follow-up 4 months

## 2016-12-31 NOTE — Progress Notes (Signed)
Subjective:    Patient ID: Michael Taylor, male    DOB: [birth year], 46 y.o.   MRN: [id number]  Michael Taylor is a 46 y.o. male presenting on 12/31/2016 for Annual Exam   HPI   Here for Annual Physical and Lab Review  Pre-Diabetes, New diagnosis Reports concerns with poor diet contributing to inc blood sugar now with A1c 5.8 (previously 5.4) CBGs: Not checking, new dx Meds: Never on meds Currently on ARB - No known fam history DM  CHRONIC HTN: Reports he checked BP once outside office, while at [facility], he had an elevated reading "170/100" Current Meds - Losartan 56m (taking only HALF tab 246mstill)   Reports good compliance, took meds today. Tolerating well, w/o complaints.  Bilateral Plantar Fasciitis, R Heel Pain / Pes Planus - Last visit with me 12/03/16, for initial visit for same problem, treated with conservative NSAIDs OTC Aleve, Tylenol, rehab/stretching home exercises, see prior notes for background information. - Interval update without any improvement, still has problem R foot heel pain, attributes some to still abnormal weight - Today patient reports he is ready to see Podiatrist, request referral - Describes R plantar fascia as "tightening or shrinking up" if leaves alone and unstretched  OBESITY BMI >38 - Reports chronic problem with weight worsening recently over past 1-2 years. Admits wt gain +19 lbs in >1.5 years. Attributed to poor lifestyle, stress among other factors. He understands needs to improve lifestyle - Also family history of heart disease, Father with heart disease history, MI in age 1861so [id number] Additional complaint episodes of increased sweating at times, usually stress related - No significant change in lifestyle or weight since last visit in 1 month. No dietary changes, not limiting carbs. - He is limited from exercising regularly due plantar fascia pain  Health Maintenance: - Due for Flu Shot in Fall 2018, will return - Declines  TDap - Completed routine HIV screen, negative - Prostate CA Screening: Last PSA 0.7 negative (12/2016). Asymptomatic. Family history of prostate CA. - No known family history of colon cancer.  Past Medical History:  Diagnosis Date  . Hypertension   . Plantar fasciitis, bilateral    right greater that left,  diagnosed by Dr.  RoClaris Che Past Surgical History:  Procedure Laterality Date  . HIP SURGERY     right,  [facility] bone graft  . MECKEL DIVERTICULUM EXCISION  2011   MaSherri Rad. SHOULDER DEBRIDEMENT     [facility]   Social History   Social History  . Marital status: Married    Spouse name: N/A  . Number of children: 3  . Years of education: JD ([employer]  Occupational History  . 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 Smoking status: Never Smoker  . Smokeless tobacco: Never Used  . Alcohol use 2.4 oz/week    4 Shots of liquor per week     Comment: rare, < 1 x monthly  . Drug use: No  . Sexual activity: Not on file   Other Topics Concern  . Not on file   Social History Narrative  . No narrative on file   Family History  Problem Relation Age of Onset  . Arthritis Mother   . Hypertension Mother   . Arthritis Father        knee replacement  . Hypertension Father   . Heart disease Father [id number].  Heart attack Father 66  . Cancer Neg Hx    Current Outpatient Prescriptions on File Prior to Visit  Medication Sig  . losartan (COZAAR) 50 MG tablet Take half pill (28m) daily for 1-2 weeks, if BP consistently >135/90, increase to whole pill (557m daily.  . Marland Kitchenmeprazole (PRILOSEC) 20 MG capsule Take 1 capsule (20 mg total) by mouth daily.   No current facility-administered medications on file prior to visit.     Review of Systems  Constitutional: Negative for activity change, appetite change, chills, diaphoresis, fatigue and fever.  HENT: Negative for congestion, hearing loss and sinus pressure.   Eyes: Negative  for visual disturbance.  Respiratory: Negative for apnea, cough, choking, chest tightness, shortness of breath and wheezing.   Cardiovascular: Negative for chest pain, palpitations and leg swelling.  Gastrointestinal: Negative for abdominal pain, constipation, diarrhea, nausea and vomiting.  Endocrine: Negative for cold intolerance and polyuria.  Genitourinary: Negative for decreased urine volume, difficulty urinating, dysuria, frequency, hematuria, penile swelling, scrotal swelling and testicular pain.  Musculoskeletal: Positive for arthralgias (R foot / heel pain) and gait problem. Negative for back pain, joint swelling and neck pain.  Skin: Negative for rash.  Allergic/Immunologic: Negative for environmental allergies.  Neurological: Negative for dizziness, weakness, light-headedness, numbness and headaches.  Hematological: Negative for adenopathy.  Psychiatric/Behavioral: Negative for behavioral problems, decreased concentration, dysphoric mood and sleep disturbance. The patient is not nervous/anxious.    Per HPI unless specifically indicated above     Objective:    BP (!) 145/89   Pulse 90   Temp 98.2 F (36.8 C) (Oral)   Resp 16   Ht '5\' 9"'  (1.753 m)   Wt 259 lb (117.5 kg)   BMI 38.25 kg/m   Wt Readings from Last 3 Encounters:  12/31/16 259 lb (117.5 kg)  12/03/16 259 lb 9.6 oz (117.8 kg)  04/23/15 230 lb (104.3 kg)    Physical Exam  Constitutional: He is oriented to person, place, and time. He appears well-developed and well-nourished. No distress.  Well-appearing, comfortable, cooperative, obese  HENT:  Head: Normocephalic and atraumatic.  Mouth/Throat: Oropharynx is clear and moist.  Frontal / maxillary sinuses non-tender. Nares patent without purulence or edema. Bilateral TMs clear without erythema, effusion or bulging. Oropharynx clear without erythema, exudates, edema or asymmetry.  Eyes: Pupils are equal, round, and reactive to light. Conjunctivae and EOM are  normal. Right eye exhibits no discharge. Left eye exhibits no discharge.  Neck: Normal range of motion. Neck supple. No thyromegaly present.  No carotid bruits  Cardiovascular: Normal rate, regular rhythm, normal heart sounds and intact distal pulses.   No murmur heard. Pulmonary/Chest: Effort normal and breath sounds normal. No respiratory distress. He has no wheezes. He has no rales.  Abdominal: Soft. Bowel sounds are normal. He exhibits no distension and no mass. There is no tenderness.  Genitourinary:  Genitourinary Comments: Declines DRE  Musculoskeletal: Normal range of motion. He exhibits no edema or tenderness.  Upper / Lower Extremities: - Normal muscle tone, strength bilateral upper extremities 5/5, lower extremities 5/5  Feet Inspection: mostly normal, on standing with flat feet pes planus, and some mild soft tissue bulk L>R medial aspect of arch near heel Palpation: tender to palpation R medial heel plantar fascia ROM: mostly intact but some stiffness with R foot Strength: intact Neurovascular: intact  Lymphadenopathy:    He has no cervical adenopathy.  Neurological: He is alert and oriented to person, place, and time.  Distal sensation intact to light  touch all extremities  Skin: Skin is warm and dry. No rash noted. He is not diaphoretic. No erythema.  Psychiatric: He has a normal mood and affect. His behavior is normal.  Well groomed, good eye contact, normal speech and thoughts  Nursing note and vitals reviewed.    Results for orders placed or performed in visit on 12/24/16  COMPLETE METABOLIC PANEL WITH GFR  Result Value Ref Range   Sodium 140 135 - 146 mmol/L   Potassium 3.9 3.5 - 5.3 mmol/L   Chloride 103 98 - 110 mmol/L   CO2 27 20 - 32 mmol/L   Glucose, Bld 111 (H) 65 - 99 mg/dL   BUN 12 7 - 25 mg/dL   Creat 0.94 0.60 - 1.35 mg/dL   Total Bilirubin 0.6 0.2 - 1.2 mg/dL   Alkaline Phosphatase 72 40 - 115 U/L   AST 25 10 - 40 U/L   ALT 37 9 - 46 U/L   Total  Protein 6.8 6.1 - 8.1 g/dL   Albumin 4.1 3.6 - 5.1 g/dL   Calcium 9.1 8.6 - 10.3 mg/dL   GFR, Est African American >89 >=60 mL/min   GFR, Est Non African American >89 >=60 mL/min  Lipid panel  Result Value Ref Range   Cholesterol 148 <200 mg/dL   Triglycerides 137 <150 mg/dL   HDL 43 >40 mg/dL   Total CHOL/HDL Ratio 3.4 <5.0 Ratio   VLDL 27 <30 mg/dL   LDL Cholesterol 78 <100 mg/dL  CBC with Differential/Platelet  Result Value Ref Range   WBC 5.2 3.8 - 10.8 K/uL   RBC 5.43 4.20 - 5.80 MIL/uL   Hemoglobin 15.5 13.2 - 17.1 g/dL   HCT 46.2 38.5 - 50.0 %   MCV 85.1 80.0 - 100.0 fL   MCH 28.5 27.0 - 33.0 pg   MCHC 33.5 32.0 - 36.0 g/dL   RDW 15.1 (H) 11.0 - 15.0 %   Platelets 230 140 - 400 K/uL   MPV 10.2 7.5 - 12.5 fL   Neutro Abs 3,016 1,500 - 7,800 cells/uL   Lymphs Abs 1,508 850 - 3,900 cells/uL   Monocytes Absolute 468 200 - 950 cells/uL   Eosinophils Absolute 156 15 - 500 cells/uL   Basophils Absolute 52 0 - 200 cells/uL   Neutrophils Relative % 58 %   Lymphocytes Relative 29 %   Monocytes Relative 9 %   Eosinophils Relative 3 %   Basophils Relative 1 %   Smear Review Criteria for review not met   Hemoglobin A1c  Result Value Ref Range   Hgb A1c MFr Bld 5.8 (H) <5.7 %   Mean Plasma Glucose 120 mg/dL  HIV antibody  Result Value Ref Range   HIV 1&2 Ab, 4th Generation NONREACTIVE NONREACTIVE  PSA, Total with Reflex to PSA, Free  Result Value Ref Range   PSA, Total 0.7 <=4.0 ng/mL  TSH  Result Value Ref Range   TSH 1.30 0.40 - 4.50 mIU/L      Assessment & Plan:   Problem List Items Addressed This Visit    Pre-diabetes    New diagnosis today, worsening control Pre-DM with A1c 5.8 (from prior 5.4) Concern with obesity, HTN  Plan:  1. Not on any therapy currently 2. Encourage improved lifestyle - low carb, low sugar diet, reduce portion size, start regular exercise once foot pain improved - DM education handouts given 3. Follow-up 4 months PreDM A1c POC trend,  consider referral to DM education / [facility]  Plantar fasciitis, bilateral    Stable chronic R foot plantar fasciitis Not improved with conservative measures NSAID, Tylenol, stretches/rehab home exercises, orthotics/insoles, activity modification - Referral to [facility] Podiatry      Relevant Orders   Ambulatory referral to Podiatry   Obesity (BMI 30-39.9)    Concern with worsening wt gain over 1.5 years up to 19 lbs At risk with HTN, PreDM Reassuring with improved lipids Again emphasized importance of diet / exercise lifestyle changes to help control blood sugar, BP, and reduce risk in future      Mixed hyperlipidemia    Controlled cholesterol, despite poor lifestyle. Not on statin. Last lipid panel 12/2016 Calculated ASCVD 10 yr risk score 2-4% (based on HTN)  Plan: 1. Reviewed ASCVD risk - for now he is at appropriate risk level, no indication for ASA / Statin currently 2. Encourage improved lifestyle - low carb/cholesterol, reduce portion size, start regular exercise once foot pain improved 3. Follow-up 4 months wt/lifestyle check, lipids q 1 yr      Essential hypertension    Improved BP control on low dose ARB, limited lifestyle changes - Home BP readings none (rare readings at pharmacy elevated) - Stable labs in interval on new ARB No known complications. At risk with PreDM   Plan:  1. INCREASE dose Losartan from 65m (half tab) now to 549mdaily (WHOLE tab) - no new rx sent 2. Encourage improved lifestyle - low sodium diet also low carb vs DASH diet, regular exercise (if plantar fascia improved) 3. Start monitor BP outside office more regularly, bring readings to next visit, if persistently >135/85 or new symptoms notify office sooner 4. Follow-up 4 weeks with outside BP log report, may adjust Losartan to 1.5 tabs dose = 7525ms 100m59mtherwise follow-up 4 months       Other Visit Diagnoses    Annual physical exam    -  Primary      No orders of the defined  types were placed in this encounter.   Follow up plan: Return in about 4 months (around 05/02/2017) for blood pressure, Pre-DM A1c, Weight.  AlexNobie Putnam SRutlandup 12/31/2016, 11:59 PM

## 2016-12-31 NOTE — Assessment & Plan Note (Signed)
New diagnosis today, worsening control Pre-DM with A1c 5.8 (from prior 5.4) Concern with obesity, HTN  Plan:  1. Not on any therapy currently 2. Encourage improved lifestyle - low carb, low sugar diet, reduce portion size, start regular exercise once foot pain improved - DM education handouts given 3. Follow-up 4 months PreDM A1c POC trend, consider referral to DM education / [place]

## 2016-12-31 NOTE — Assessment & Plan Note (Signed)
Stable chronic R foot plantar fasciitis Not improved with conservative measures NSAID, Tylenol, stretches/rehab home exercises, orthotics/insoles, activity modification - Referral to [facility] Podiatry

## 2016-12-31 NOTE — Patient Instructions (Addendum)
Thank you for coming to the clinic today.  1.  Referral today to [place] Address: [address] [place], [place] [address] Hours: Open 8AM-5PM Phone: [phone]  2. START taking WHOLE pill 50mg  Losartan (or TWO HALVES) once daily in morning, start checking BP more regular 1-2x weekly, write down readings, call me in 4 weeks with results, if still >135/85 consistently, then would take ONE and HALF pills for dose of 75mg  if needed and monitor BP   Eat at least 3 meals and 1-2 snacks per day (don't skip breakfast).  Aim for no more than 5 hours between eating. - Tip: If you go >5 hours without eating and become very hungry, your body will supply it's own resources temporarily and you can gain extra weight when you eat.  3. You are considered a Pre-Diabetic A1c 5.8  Diet Recommendations for Preventing Diabetes   Starchy (carb) foods include: Bread, rice, pasta, potatoes, corn, crackers, bagels, muffins, all baked goods.   Protein foods include: Meat, fish, poultry, eggs, dairy foods, and beans such as pinto and kidney beans (beans also provide carbohydrate).   1. Eat at least 3 meals and 1-2 snacks per day. Never go more than 4-5 hours while awake without eating.   2. Limit starchy foods to TWO per meal and ONE per snack. ONE portion of a starchy  food is equal to the following:   - ONE slice of bread (or its equivalent, such as half of a hamburger bun).   - 1/2 cup of a "scoopable" starchy food such as potatoes or rice.   - 1 OUNCE (28 grams) of starchy snacks (crackers or pretzels, look on label).   - 15 grams of carbohydrate as shown on food label.   3. Both lunch and dinner should include a protein food, a carb food, and vegetables.   - Obtain twice as many veg's as protein or carbohydrate foods for both lunch and dinner.   - Try to keep frozen veg's on hand for a quick vegetable serving.     - Fresh or frozen veg's are best.   4. Breakfast  should always include protein.     Please schedule a Follow-up Appointment to: Return in about 4 months (around 05/02/2017) for blood pressure, Pre-DM A1c, Weight.  If you have any other questions or concerns, please feel free to call the clinic or send a message through [facility]. You may also schedule an earlier appointment if necessary.  Additionally, you may be receiving a survey about your experience at our clinic within a few days to 1 week by e-mail or mail. We value your feedback.  [name], DO [facility]

## 2017-01-20 ENCOUNTER — Ambulatory Visit (INDEPENDENT_AMBULATORY_CARE_PROVIDER_SITE_OTHER): Payer: BC Managed Care – PPO | Admitting: Podiatry

## 2017-01-20 ENCOUNTER — Encounter: Payer: Self-pay | Admitting: Podiatry

## 2017-01-20 ENCOUNTER — Ambulatory Visit (INDEPENDENT_AMBULATORY_CARE_PROVIDER_SITE_OTHER): Payer: BC Managed Care – PPO

## 2017-01-20 DIAGNOSIS — M722 Plantar fascial fibromatosis: Secondary | ICD-10-CM | POA: Diagnosis not present

## 2017-01-20 MED ORDER — DICLOFENAC SODIUM 75 MG PO TBEC: 75.0000 mg | DELAYED_RELEASE_TABLET | Freq: Two times a day (BID) | ORAL | 0 refills | Status: DC

## 2017-01-20 NOTE — Progress Notes (Signed)
   Subjective:    Patient ID: Michael Taylor, male    DOB: January 27, 1971, 46 y.o.   MRN: [id number]  HPI    Review of Systems     Objective:   Physical Exam        Assessment & Plan:

## 2017-01-23 MED ORDER — BETAMETHASONE SOD PHOS & ACET 6 (3-3) MG/ML IJ SUSP: 3.0000 mg | Freq: Once | INTRAMUSCULAR | Status: DC

## 2017-01-23 NOTE — Progress Notes (Signed)
   Subjective: Patient is a 46 year old male presenting today as a new patient with a complaint of pain and tenderness in the plantar aspect of the right heel that began two months ago. He states he was seen by Dr. Paulla Dolly several years ago and diagnosed with plantar fasciitis after stepping on a twig and tearing the right fascial band. Patient states that it hurts in the mornings with the first steps out of bed. He denies any new trauma or injury. Patient presents today for further treatment and evaluation.  Objective: Physical Exam General: The patient is alert and oriented x3 in no acute distress.  Dermatology: Skin is warm, dry and supple bilateral lower extremities. Negative for open lesions or macerations bilateral.   Vascular: Dorsalis Pedis and Posterior Tibial pulses palpable bilateral.  Capillary fill time is immediate to all digits.  Neurological: Epicritic and protective threshold intact bilateral.   Musculoskeletal: Tenderness to palpation at the medial calcaneal tubercale and through the insertion of the plantar fascia of the right foot. All other joints range of motion within normal limits bilateral. Strength 5/5 in all groups bilateral.   Radiographic exam: Normal osseous mineralization. Joint spaces preserved. No fracture/dislocation/boney destruction. Calcaneal spur present with mild thickening of plantar fascia right. No other soft tissue abnormalities or radiopaque foreign bodies.   Assessment: 1. Plantar fasciitis right 2. Pain in right foot  Plan of Care:  1. Patient evaluated. Xrays reviewed.   2. Injection of 0.5cc Celestone soluspan injected into the right plantar fascia  3. Rx for Diclofenac 75mg  PO BID ordered for patient. 4. Plantar fascial band(s) dispensed 5. Instructed patient regarding therapies and modalities at home to alleviate symptoms.  6. Return to clinic in 4 weeks.   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    Edrick Kins,  DPM [facility]  Dr. Edrick Kins, DPM    [address], [place] [address]                Office [phone]  Fax [phone]

## 2017-02-20 ENCOUNTER — Ambulatory Visit: Payer: BC Managed Care – PPO | Admitting: Podiatry

## 2017-03-03 ENCOUNTER — Encounter: Payer: Self-pay | Admitting: Podiatry

## 2017-03-03 ENCOUNTER — Ambulatory Visit (INDEPENDENT_AMBULATORY_CARE_PROVIDER_SITE_OTHER): Payer: BC Managed Care – PPO | Admitting: Podiatry

## 2017-03-03 DIAGNOSIS — M722 Plantar fascial fibromatosis: Secondary | ICD-10-CM

## 2017-03-03 MED ORDER — METHYLPREDNISOLONE 4 MG PO TBPK: ORAL_TABLET | ORAL | 0 refills | Status: DC

## 2017-03-03 MED ORDER — BETAMETHASONE SOD PHOS & ACET 6 (3-3) MG/ML IJ SUSP: 3.0000 mg | Freq: Once | INTRAMUSCULAR | Status: DC

## 2017-03-03 MED ORDER — DICLOFENAC SODIUM 75 MG PO TBEC: 75.0000 mg | DELAYED_RELEASE_TABLET | Freq: Two times a day (BID) | ORAL | 1 refills | Status: DC

## 2017-03-05 NOTE — Progress Notes (Signed)
   Subjective: Patient is a 46 year old male presenting today for follow-up evaluation of right plantar fasciitis. He states the pain is about the same and is intermittent. He denies any pain at this time rating it 0/10. Wearing the inserts is not alleviating the pain. He is here for further evaluation and treatment.   Past Medical History:  Diagnosis Date  . Hypertension   . Plantar fasciitis, bilateral    right greater that left,  diagnosed by Dr.  Claris Che    Objective: Physical Exam General: The patient is alert and oriented x3 in no acute distress.  Dermatology: Skin is warm, dry and supple bilateral lower extremities. Negative for open lesions or macerations bilateral.   Vascular: Dorsalis Pedis and Posterior Tibial pulses palpable bilateral.  Capillary fill time is immediate to all digits.  Neurological: Epicritic and protective threshold intact bilateral.   Musculoskeletal: Tenderness to palpation at the medial calcaneal tubercale and through the insertion of the plantar fascia of the right foot. All other joints range of motion within normal limits bilateral. Strength 5/5 in all groups bilateral.    Assessment: 1. Plantar fasciitis right 2. Pain in right foot  Plan of Care:  1. Patient evaluated. 2. Injection of 0.5cc Celestone soluspan injected into the right plantar fascia  3. Prescription for Medrol Dosepak given to patient. 4. Prescription for diclofenac given to patient. 5. Continue wearing OTC insoles. 6. Return to clinic in 1 month.  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   Edrick Kins, DPM [facility]  Dr. Edrick Kins, DPM    [address], [place] [address]                Office [phone]  Fax [phone]

## 2017-04-14 ENCOUNTER — Ambulatory Visit: Payer: BC Managed Care – PPO | Admitting: Podiatry

## 2017-04-14 ENCOUNTER — Encounter: Payer: Self-pay | Admitting: Podiatry

## 2017-04-14 DIAGNOSIS — M722 Plantar fascial fibromatosis: Secondary | ICD-10-CM

## 2017-04-15 ENCOUNTER — Ambulatory Visit: Payer: BC Managed Care – PPO | Admitting: Family Medicine

## 2017-04-16 NOTE — Progress Notes (Signed)
   Subjective: Patient is a 46 year old male presenting today for follow-up evaluation of right plantar fasciitis.  He states the pain is improved.  He still reports some continued pain in the mornings.  He has been taking meloxicam which helps alleviate the pain.  Patient is here for further evaluation and treatment.   Past Medical History:  Diagnosis Date  . Hypertension   . Plantar fasciitis, bilateral    right greater that left,  diagnosed by Dr.  Claris Che    Objective: Physical Exam General: The patient is alert and oriented x3 in no acute distress.  Dermatology: Skin is warm, dry and supple bilateral lower extremities. Negative for open lesions or macerations bilateral.   Vascular: Dorsalis Pedis and Posterior Tibial pulses palpable bilateral.  Capillary fill time is immediate to all digits.  Neurological: Epicritic and protective threshold intact bilateral.   Musculoskeletal: Tenderness to palpation at the medial calcaneal tubercale and through the insertion of the plantar fascia of the right foot. All other joints range of motion within normal limits bilateral. Strength 5/5 in all groups bilateral.    Assessment: 1. Plantar fasciitis right 2. Pain in right foot  Plan of Care:  1. Patient evaluated. 2. Injection of 0.5cc Celestone soluspan injected into the right plantar fascia  3.  Continue taking meloxicam. 4.  Continue wearing good shoe gear. 5.  Return to clinic as needed.  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   Edrick Kins, DPM [facility]  Dr. Edrick Kins, DPM    [address], [place] [address]                Office [phone]  Fax [phone]

## 2017-04-24 ENCOUNTER — Ambulatory Visit: Payer: BC Managed Care – PPO | Admitting: Family Medicine

## 2017-04-24 ENCOUNTER — Encounter: Payer: Self-pay | Admitting: Family Medicine

## 2017-04-24 VITALS — BP 142/88 | HR 83 | Temp 98.5°F | Resp 16 | Ht 69.0 in | Wt 261.0 lb

## 2017-04-24 DIAGNOSIS — R7303 Prediabetes: Secondary | ICD-10-CM | POA: Diagnosis not present

## 2017-04-24 DIAGNOSIS — I10 Essential (primary) hypertension: Secondary | ICD-10-CM

## 2017-04-24 DIAGNOSIS — M722 Plantar fascial fibromatosis: Secondary | ICD-10-CM

## 2017-04-24 LAB — POCT GLYCOSYLATED HEMOGLOBIN (HGB A1C): Hemoglobin A1C: 5.7 (ref ?–5.7)

## 2017-04-24 MED ORDER — LOSARTAN POTASSIUM 50 MG PO TABS: 50.0000 mg | ORAL_TABLET | Freq: Every day | ORAL | 1 refills | Status: DC

## 2017-04-24 NOTE — Progress Notes (Signed)
Subjective:    Patient ID: Michael Taylor, male    DOB: 1971/01/15, 45 y.o.   MRN: [id number]  Michael Taylor is a 46 y.o. male presenting on 04/24/2017 for Hypertension   HPI   FOLLOW-UP Pre-Diabetes - Last visit with me 12/31/16, for annual, discussed same problem with new dx PreDM based on elevated A1c 5.8, treated with lifestyle modifications, see prior notes for background information. - Interval update with some mixed improvement in lifestyle, still with plans to improve - Today patient reports he has become more active and starting to exercise now that his R foot plantar fasciitis is much improved, nearly resolved. - Tries to improve diet, less sugary and starchy foods. Does admit recent dietary indiscretions  CBGs: Not checking, new dx Meds: Never on meds Currently on ARB - No known fam history DM - Weight +2 lbs in several months, some fluctuating weight - Denies polyuria, numbness, tingling  CHRONIC HTN: - Last visit with me 12/31/16, for annual physical discussed same problem, treated with increased Losartan from 25 to 50mg  daily, see prior notes for background information. - Interval update with initially improved adherence did well, but recently had problem with taking pill did not take for 2 weeks due to vacation and other issues with his foot treatment on prednisone - Today patient reports still feels well with regards to BP. 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 Tolerating well, w/o complaints. - Denies headache, lightheaded, dizziness, CP, dyspnea  Bilateral Plantar Fasciitis (R>L) Today reports no pain. Significantly improved following [facility] Dr Amalia Hailey, has received injections x3, prednisone and directed stretching rehab, also meloxicam NSAID. - Increased exercise and activity now -  Denies pain, swelling redness injury   Depression screen [facility] 2/9 04/24/2017 12/04/2016 01/19/2015  Decreased Interest 0 0 0  Down, Depressed, Hopeless 0 0 0  PHQ - 2 Score 0 0 0    Social History   Tobacco Use  . Smoking status: Never Smoker  . Smokeless tobacco: Never Used  Substance Use Topics  . Alcohol use: Yes    Alcohol/week: 2.4 oz    Types: 4 Shots of liquor per week    Comment: rare, < 1 x monthly  . Drug use: No    Review of Systems Per HPI unless specifically indicated above     Objective:    BP (!) 142/88 (BP Location: Left Arm, Cuff Size: Normal)   Pulse 83   Temp 98.5 F (36.9 C) (Oral)   Resp 16   Ht 5\' 9"  (1.753 m)   Wt 261 lb (118.4 kg)   BMI 38.54 kg/m   Wt Readings from Last 3 Encounters:  04/24/17 261 lb (118.4 kg)  12/31/16 259 lb (117.5 kg)  12/03/16 259 lb 9.6 oz (117.8 kg)    Physical Exam  Constitutional: He is oriented to person, place, and time. He appears well-developed and well-nourished. No distress.  Well-appearing, comfortable, cooperative, obese  HENT:  Head: Normocephalic and atraumatic.  Mouth/Throat: Oropharynx is clear and moist.  Eyes: Conjunctivae are normal. Right eye exhibits no discharge. Left eye exhibits no discharge.  Cardiovascular: Normal rate, regular rhythm, normal heart sounds and intact distal pulses.  No murmur heard. Pulmonary/Chest: Effort normal.  Musculoskeletal: He exhibits no edema.  R foot improved, non tender  Neurological: He is alert and oriented to person, place, and time.  Skin: Skin is warm and dry. No rash noted. He is not diaphoretic. No erythema.  Psychiatric: His behavior is normal.  Nursing note and vitals reviewed.  Results for orders placed or performed in visit on 04/24/17  POCT HgB A1C  Result Value Ref Range   Hemoglobin A1C 5.7 5.7      Assessment & Plan:   Problem List Items Addressed This Visit    Essential hypertension    Still mildly improved BP control, on inc ARB and  improved lifestyle, yet no wt loss yet - Outside BP readings at work are borderline elevated but improved seems >135/85 still avg No known complications. At risk with PreDM    Plan:  1. Continue Losartan 50mg  daily - refilled 2. Encourage improved lifestyle - low sodium diet also low carb vs DASH diet, improve regular exercise - goal wt loss 3. Continue monitor BP outside office at work, bring readings to next visit, if persistently >135/85 or new symptoms notify office sooner 4. Follow-up 4 months - agree to continue same dose for now, discussed if BP still near >135/85 will inc dose vs add 2nd agent      Relevant Medications   losartan (COZAAR) 50 MG tablet   Plantar fasciitis, bilateral    Significantly improved R>L chronic plantar fasciitis S/p [facility] pod[facility]ry treatment injections x 3, prednisone, meloxicam, stretching      Pre-diabetes - Primary    Stable to improved PreDM with A1c 5.7 from 5.8, relatively new dx Improving lifestyle Concern with obesity, HTN  Plan:  1. Not on any therapy currently 2. Encourage continued improved lifestyle - low carb, low sugar diet, reduce portion size, continue to improve regular exercise goal wt loss 3. Follow-up 4 months PreDM A1c POC trend, consider referral to DM education / [place]      Relevant Orders   POCT HgB A1C (Completed)      Meds ordered this encounter  Medications  . losartan (COZAAR) 50 MG tablet    Sig: Take 1 tablet (50 mg total) by mouth daily.    Dispense:  90 tablet    Refill:  1    Follow up plan: Return in about 4 months (around 08/23/2017) for Pre-DM A1c, HTN.  [name], [facility] 04/24/2017, 9:16 PM

## 2017-04-24 NOTE — Assessment & Plan Note (Signed)
Stable to improved PreDM with A1c 5.7 from 5.8, relatively new dx Improving lifestyle Concern with obesity, HTN  Plan:  1. Not on any therapy currently 2. Encourage continued improved lifestyle - low carb, low sugar diet, reduce portion size, continue to improve regular exercise goal wt loss 3. Follow-up 4 months PreDM A1c POC trend, consider referral to DM education / [place]

## 2017-04-24 NOTE — Assessment & Plan Note (Addendum)
Significantly improved R>L chronic plantar fasciitis S/p [facility] pod[facility]ry treatment injections x 3, prednisone, meloxicam, stretching

## 2017-04-24 NOTE — Patient Instructions (Addendum)
Thank you for coming to the clinic today.  1. A1c improved 5.7, last time 5.8 - Pre-Dm (5.7 to 6.4) - overall great job - as discussed prednisone could have raise sugar, but it has not changed a1c much  2. BP is improved overall but still borderline, goal is < 135 / 85 Keep checking BP at work  We may consider adding 2nd agent next time if still similar BP  Please schedule a Follow-up Appointment to: Return in about 4 months (around 08/23/2017) for Pre-DM A1c, HTN.  If you have any other questions or concerns, please feel free to call the clinic or send a message through [place]. You may also schedule an earlier appointment if necessary.  Additionally, you may be receiving a survey about your experience at our clinic within a few days to 1 week by e-mail or mail. We value your feedback.  [name], DO [place]

## 2017-04-24 NOTE — Assessment & Plan Note (Addendum)
Still mildly improved BP control, on inc ARB and improved lifestyle, yet no wt loss yet - Outside BP readings at work are borderline elevated but improved seems >135/85 still avg No known complications. At risk with PreDM    Plan:  1. Continue Losartan 50mg  daily - refilled 2. Encourage improved lifestyle - low sodium diet also low carb vs DASH diet, improve regular exercise - goal wt loss 3. Continue monitor BP outside office at work, bring readings to next visit, if persistently >135/85 or new symptoms notify office sooner 4. Follow-up 4 months - agree to continue same dose for now, discussed if BP still near >135/85 will inc dose vs add 2nd agent

## 2017-08-21 ENCOUNTER — Other Ambulatory Visit: Payer: Self-pay | Admitting: Family Medicine

## 2017-08-21 ENCOUNTER — Ambulatory Visit (INDEPENDENT_AMBULATORY_CARE_PROVIDER_SITE_OTHER): Payer: BLUE CROSS/BLUE SHIELD | Admitting: Family Medicine

## 2017-08-21 ENCOUNTER — Encounter: Payer: Self-pay | Admitting: Family Medicine

## 2017-08-21 VITALS — BP 143/99 | HR 84 | Temp 98.5°F | Resp 16 | Ht 69.0 in | Wt 264.0 lb

## 2017-08-21 DIAGNOSIS — Z125 Encounter for screening for malignant neoplasm of prostate: Secondary | ICD-10-CM

## 2017-08-21 DIAGNOSIS — E669 Obesity, unspecified: Secondary | ICD-10-CM

## 2017-08-21 DIAGNOSIS — R7303 Prediabetes: Secondary | ICD-10-CM

## 2017-08-21 DIAGNOSIS — M722 Plantar fascial fibromatosis: Secondary | ICD-10-CM

## 2017-08-21 DIAGNOSIS — Z0000 Encounter for general adult medical examination without abnormal findings: Secondary | ICD-10-CM

## 2017-08-21 DIAGNOSIS — K219 Gastro-esophageal reflux disease without esophagitis: Secondary | ICD-10-CM

## 2017-08-21 DIAGNOSIS — I10 Essential (primary) hypertension: Secondary | ICD-10-CM

## 2017-08-21 DIAGNOSIS — E782 Mixed hyperlipidemia: Secondary | ICD-10-CM

## 2017-08-21 LAB — POCT GLYCOSYLATED HEMOGLOBIN (HGB A1C): Hemoglobin A1C: 5.9 — AB (ref ?–5.7)

## 2017-08-21 MED ORDER — LOSARTAN POTASSIUM 50 MG PO TABS: 50.0000 mg | ORAL_TABLET | Freq: Every day | ORAL | 3 refills | Status: DC

## 2017-08-21 MED ORDER — OMEPRAZOLE 20 MG PO CPDR: 20.0000 mg | DELAYED_RELEASE_CAPSULE | Freq: Every day | ORAL | 3 refills | Status: AC

## 2017-08-21 NOTE — Assessment & Plan Note (Signed)
Elevated BP today, off med, needs refill - Outside BP readings at work are borderline elevated but improved seems >135/85 still avg No known complications. At risk with PreDM    Plan:  1. Continue Losartan 50mg  daily - refilled today 2. Encourage improved lifestyle - low sodium diet also low carb vs DASH diet, improve regular exercise - goal wt loss 3. Continue monitor BP outside office at work, bring readings to next visit, if persistently >135/85 or new symptoms notify office sooner 4. Follow-up 4 months - agree to continue same dose for now, discussed if BP still near >135/85 will inc dose vs add 2nd agent - likely 1.5 pills or 2 pills for 75 to 100mg  of Losartan

## 2017-08-21 NOTE — Assessment & Plan Note (Signed)
Stable to PreDM with A1c 5.9 (from prior 5.7 to 5.8) Improving lifestyle Concern with obesity, HTN  Plan:  1. Not on any therapy currently 2. Encourage continued improved lifestyle - low carb, low sugar diet, reduce portion size, continue to improve regular exercise goal wt loss 3. Follow-up 4 months annual + labs A1c / consider referral to DM education / [place]

## 2017-08-21 NOTE — Patient Instructions (Addendum)
Thank you for coming to the office today.  Refilled Losartan 50mg  daily - Keep track of BP - if pressure stays higher >135/85, can call office before visit, and we may advise to take 1 and HALF pills or possibly two pills daily  A1c 5.9, similar to previous - recommend keep improving diet and lifestyle overall  Continue with foot stretching, may consider low impact exercises  DUE for FASTING BLOOD WORK (no food or drink after midnight before the lab appointment, only water or coffee without cream/sugar on the morning of)  SCHEDULE "Lab Only" visit in the morning at the clinic for lab draw in 4 MONTHS   - Make sure Lab Only appointment is at about 1 week before your next appointment, so that results will be available  For Lab Results, once available within 2-3 days of blood draw, you can can log in to MyChart online to view your results and a brief explanation. Also, we can discuss results at next follow-up visit.  Please schedule a Follow-up Appointment to: Return in about 4 months (around 12/21/2017) for Annual Physical.  If you have any other questions or concerns, please feel free to call the office or send a message through [place]. You may also schedule an earlier appointment if necessary.  Additionally, you may be receiving a survey about your experience at our office within a few days to 1 week by e-mail or mail. We value your feedback.  [name], DO [place]

## 2017-08-21 NOTE — Progress Notes (Signed)
Subjective:    Patient ID: Michael Taylor, male    DOB: [birth year], 47 y.o.   MRN: [id number]  Michael Taylor is a 47 y.o. male presenting on 08/21/2017 for Hypertension and pre-diabetes   HPI   FOLLOW-UP Pre-Diabetes / Obesity BMI >38 - Last visit with me 04/2017, see prior notes for background information. - Today patient reports he is working on improving lifestyle. Now R foot improved CBGs:Not checking Meds:Never on meds Currently on ARB for HTN Lifestyle: - Weight gain mostly 2-4 lbs past few weeks to months - Diet reduced sodas, more water, trying to eat more balanced diet, not many other changes - Exercise: Increased activity, walking regularly, out plays soccer with his girls - No known fam history DM - Denies polyuria, numbness, tingling  CHRONIC HTN: - Last visit with me 04/2017, see prior notes for background information. - Interval update - ran out of Losartan for past >2 weeks he changed pharmacy from [facility] to [facility], now needs new rx today - Today patient reports he was checking BP at home back on medicine with range SBP 120s-130s, DBP 80-90s Current Meds -Losartan 50mg  daily Reports good compliance when he was taking med, now ran out, needs refill did not take today. Tolerating well, w/o complaints. - Denies headache, lightheaded, dizziness, CP, dyspnea  FOLLOW-UP Plantar fasciitis, bilateral (R>L) Last seen by me for this problem back in 12/2016 and 04/2017. He was given conservative therapy and then referred to [facility], saw Dr Amalia Hailey several times, last visit 04/14/17, he has received Celestone steroid injections into plantar fascia on R side, and was on NSAIDs, he has done strict regimen of stretching. - Today he reports doing well, only mild flare up of plantar fascia pain at times R > L, usually only if he is seated long period of time or inactive. He does stretching regularly to avoid tightening and flare up - Denies pain or injury or swelling or  redness  Depression screen [facility] 2/9 08/21/2017 04/24/2017 12/04/2016  Decreased Interest 0 0 0  Down, Depressed, Hopeless 0 0 0  PHQ - 2 Score 0 0 0    Social History   Tobacco Use  . Smoking status: Never Smoker  . Smokeless tobacco: Never Used  Substance Use Topics  . Alcohol use: Yes    Alcohol/week: 2.4 oz    Types: 4 Shots of liquor per week    Comment: rare, < 1 x monthly  . Drug use: No    Review of Systems Per HPI unless specifically indicated above     Objective:    BP (!) 143/99   Pulse 84   Temp 98.5 F (36.9 C) (Oral)   Resp 16   Ht 5\' 9"  (1.753 m)   Wt 264 lb (119.7 kg)   BMI 38.99 kg/m   Wt Readings from Last 3 Encounters:  08/21/17 264 lb (119.7 kg)  04/24/17 261 lb (118.4 kg)  12/31/16 259 lb (117.5 kg)    Physical Exam  Constitutional: He is oriented to person, place, and time. He appears well-developed and well-nourished. No distress.  Well-appearing, comfortable, cooperative, obese  HENT:  Head: Normocephalic and atraumatic.  Mouth/Throat: Oropharynx is clear and moist.  Eyes: Conjunctivae are normal. Right eye exhibits no discharge. Left eye exhibits no discharge.  Neck: Normal range of motion. Neck supple.  Cardiovascular: Normal rate, regular rhythm, normal heart sounds and intact distal pulses.  No murmur heard. Pulmonary/Chest: Effort normal and breath sounds normal. No  respiratory distress. He has no wheezes. He has no rales.  Musculoskeletal: Normal range of motion. He exhibits no edema.  Did not re-examine foot today.  Neurological: He is alert and oriented to person, place, and time.  Skin: Skin is warm and dry. No rash noted. He is not diaphoretic. No erythema.  Psychiatric: He has a normal mood and affect. His behavior is normal.  Well groomed, good eye contact, normal speech and thoughts  Nursing note and vitals reviewed.  Results for orders placed or performed in visit on 08/21/17  POCT HgB A1C  Result Value Ref Range    Hemoglobin A1C 5.9 (A) 5.7   Recent Labs    12/23/16 1228 04/24/17 1638 08/21/17 1454  HGBA1C 5.8* 5.7 5.9*      Assessment & Plan:   Problem List Items Addressed This Visit    Essential hypertension    Elevated BP today, off med, needs refill - Outside BP readings at work are borderline elevated but improved seems >135/85 still avg No known complications. At risk with PreDM    Plan:  1. Continue Losartan 50mg  daily - refilled today 2. Encourage improved lifestyle - low sodium diet also low carb vs DASH diet, improve regular exercise - goal wt loss 3. Continue monitor BP outside office at work, bring readings to next visit, if persistently >135/85 or new symptoms notify office sooner 4. Follow-up 4 months - agree to continue same dose for now, discussed if BP still near >135/85 will inc dose vs add 2nd agent - likely 1.5 pills or 2 pills for 75 to 100mg  of Losartan      Relevant Medications   losartan (COZAAR) 50 MG tablet   GERD (gastroesophageal reflux disease)    Updated chart that he is taking OTC instead of Rx Not discussed today      Relevant Medications   omeprazole (PRILOSEC) 20 MG capsule   Obesity (BMI 30-39.9)    Concern with worsening wt gain 3-4 lbs in 4-5 months At risk with HTN, PreDM Emphasized importance of diet / exercise lifestyle changes to help weight loss and control blood sugar, BP, and reduce risk in future      Plantar fasciitis, bilateral    Improved plantar fasciitis pain, now intermittently only after seen Podiatry injections NSAID and stretching - Continues mild intermittent R foot pain flare ups, much improved overall - Follow-up PRN      Pre-diabetes - Primary    Stable to PreDM with A1c 5.9 (from prior 5.7 to 5.8) Improving lifestyle Concern with obesity, HTN  Plan:  1. Not on any therapy currently 2. Encourage continued improved lifestyle - low carb, low sugar diet, reduce portion size, continue to improve regular exercise goal wt  loss 3. Follow-up 4 months annual + labs A1c / consider referral to [facility] education / [facility]      Relevant Orders   POCT HgB A1C (Completed)      Meds ordered this encounter  Medications  . omeprazole (PRILOSEC) 20 MG capsule    Sig: Take 1 capsule (20 mg total) by mouth daily.    Dispense:  90 capsule    Refill:  3  . losartan (COZAAR) 50 MG tablet    Sig: Take 1 tablet (50 mg total) by mouth daily.    Dispense:  90 tablet    Refill:  3    Follow up plan: Return in about 4 months (around 12/21/2017) for Annual Physical.  Future labs ordered for 12/24/17  [name], [facility] 08/21/2017, 3:19 PM

## 2017-08-21 NOTE — Assessment & Plan Note (Signed)
Improved plantar fasciitis pain, now intermittently only after seen Podiatry injections NSAID and stretching - Continues mild intermittent R foot pain flare ups, much improved overall - Follow-up PRN

## 2017-08-21 NOTE — Assessment & Plan Note (Signed)
Updated chart that he is taking OTC instead of Rx Not discussed today

## 2017-08-21 NOTE — Assessment & Plan Note (Signed)
Concern with worsening wt gain 3-4 lbs in 4-5 months At risk with HTN, PreDM Emphasized importance of diet / exercise lifestyle changes to help weight loss and control blood sugar, BP, and reduce risk in future

## 2017-12-24 ENCOUNTER — Other Ambulatory Visit: Payer: BLUE CROSS/BLUE SHIELD

## 2017-12-24 DIAGNOSIS — R7303 Prediabetes: Secondary | ICD-10-CM

## 2017-12-24 DIAGNOSIS — I10 Essential (primary) hypertension: Secondary | ICD-10-CM

## 2017-12-24 DIAGNOSIS — E782 Mixed hyperlipidemia: Secondary | ICD-10-CM

## 2017-12-24 DIAGNOSIS — Z0000 Encounter for general adult medical examination without abnormal findings: Secondary | ICD-10-CM

## 2017-12-24 DIAGNOSIS — Z125 Encounter for screening for malignant neoplasm of prostate: Secondary | ICD-10-CM

## 2017-12-25 LAB — COMPLETE METABOLIC PANEL WITH GFR
AG Ratio: 1.5 (calc) (ref 1.0–2.5)
ALT: 34 U/L (ref 9–46)
AST: 24 U/L (ref 10–40)
Albumin: 4.1 g/dL (ref 3.6–5.1)
Alkaline phosphatase (APISO): 61 U/L (ref 40–115)
BUN: 11 mg/dL (ref 7–25)
CO2: 30 mmol/L (ref 20–32)
CREATININE: 0.93 mg/dL (ref 0.60–1.35)
Calcium: 9 mg/dL (ref 8.6–10.3)
Chloride: 104 mmol/L (ref 98–110)
GFR, EST NON AFRICAN AMERICAN: 96 mL/min/{1.73_m2} (ref >=60)
GFR, Est African American: 111 mL/min/{1.73_m2} (ref >=60)
GLUCOSE: 104 mg/dL — AB (ref 65–99)
Globulin: 2.7 g/dL (calc) (ref 1.9–3.7)
Potassium: 4.2 mmol/L (ref 3.5–5.3)
SODIUM: 140 mmol/L (ref 135–146)
TOTAL PROTEIN: 6.8 g/dL (ref 6.1–8.1)
Total Bilirubin: 0.8 mg/dL (ref 0.2–1.2)

## 2017-12-25 LAB — CBC WITH DIFFERENTIAL/PLATELET
BASOS ABS: 41 {cells}/uL (ref 0–200)
Basophils Relative: 0.8 %
EOS ABS: 112 {cells}/uL (ref 15–500)
Eosinophils Relative: 2.2 %
HCT: 43.8 % (ref 38.5–50.0)
Hemoglobin: 14.9 g/dL (ref 13.2–17.1)
Lymphs Abs: 1489 cells/uL (ref 850–3900)
MCH: 27.9 pg (ref 27.0–33.0)
MCHC: 34 g/dL (ref 32.0–36.0)
MCV: 81.9 fL (ref 80.0–100.0)
MPV: 11.8 fL (ref 7.5–12.5)
Monocytes Relative: 8.1 %
NEUTROS PCT: 59.7 %
Neutro Abs: 3045 cells/uL (ref 1500–7800)
PLATELETS: 135 10*3/uL — AB (ref 140–400)
RBC: 5.35 10*6/uL (ref 4.20–5.80)
RDW: 15.2 % — AB (ref 11.0–15.0)
Total Lymphocyte: 29.2 %
WBC: 5.1 10*3/uL (ref 3.8–10.8)
WBCMIX: 413 {cells}/uL (ref 200–950)

## 2017-12-25 LAB — HEMOGLOBIN A1C
Hgb A1c MFr Bld: 5.8 % of total Hgb — ABNORMAL HIGH (ref <5.7)
Mean Plasma Glucose: 120 (calc)
eAG (mmol/L): 6.6 (calc)

## 2017-12-25 LAB — LIPID PANEL
CHOL/HDL RATIO: 3.7 (calc) (ref <5.0)
Cholesterol: 136 mg/dL (ref <200)
HDL: 37 mg/dL — ABNORMAL LOW (ref >40)
LDL Cholesterol (Calc): 73 mg/dL (calc)
Non-HDL Cholesterol (Calc): 99 mg/dL (calc) (ref <130)
TRIGLYCERIDES: 179 mg/dL — AB (ref <150)

## 2017-12-25 LAB — PSA, TOTAL WITH REFLEX TO PSA, FREE: PSA, Total: 0.6 ng/mL (ref <=4.0)

## 2017-12-29 ENCOUNTER — Encounter: Payer: Self-pay | Admitting: Family Medicine

## 2017-12-29 ENCOUNTER — Ambulatory Visit (INDEPENDENT_AMBULATORY_CARE_PROVIDER_SITE_OTHER): Payer: BLUE CROSS/BLUE SHIELD | Admitting: Family Medicine

## 2017-12-29 VITALS — BP 134/80 | HR 87 | Temp 98.4°F | Resp 16 | Ht 69.0 in | Wt 257.0 lb

## 2017-12-29 DIAGNOSIS — I10 Essential (primary) hypertension: Secondary | ICD-10-CM | POA: Diagnosis not present

## 2017-12-29 DIAGNOSIS — E669 Obesity, unspecified: Secondary | ICD-10-CM

## 2017-12-29 DIAGNOSIS — Z125 Encounter for screening for malignant neoplasm of prostate: Secondary | ICD-10-CM

## 2017-12-29 DIAGNOSIS — Z0000 Encounter for general adult medical examination without abnormal findings: Secondary | ICD-10-CM

## 2017-12-29 DIAGNOSIS — E782 Mixed hyperlipidemia: Secondary | ICD-10-CM | POA: Diagnosis not present

## 2017-12-29 DIAGNOSIS — R7303 Prediabetes: Secondary | ICD-10-CM

## 2017-12-29 DIAGNOSIS — D171 Benign lipomatous neoplasm of skin and subcutaneous tissue of trunk: Secondary | ICD-10-CM

## 2017-12-29 NOTE — Patient Instructions (Addendum)
Thank you for coming to the office today.  1. Chemistry - Normal results, including electrolytes, kidney and liver function. Slightly elevated fasting blood sugar   2. Hemoglobin A1c (Pre-Diabetes) - 5.8, improved from last time 5.9, in range of Pre-Diabetes (>5.7 to 6.4)   3. PSA Prostate Cancer Screening - 0.6, negative.  4. Cholesterol - Slightly lower 37 from 43 HDL (good cholesterol), low normal 73 LDL (bad cholesterol), and slightly higher 179 from 137 Triglycerides. Not indicated for statin cholesterol medication.  5. CBC Blood Counts - Slightly low platelets, otherwise normal, no anemia, other abnormality  -------------------------------  Start checking BP occasionally at pharmacy or [facility] - or can purchase cuff - check insurance coverage  If readings are normal < 140/90 consistently then can space out more and check only maybe 1 x week or few times a month, write down readings, bring to next visit.  Call back sooner if elevated readings >140/90 can return after about 1 month to discuss or call me and we can discuss  Likely next visit we can order Colonoscopy screening - referral.  Please schedule a Follow-up Appointment to: Return in about 6 months (around 07/01/2018) for PreDM A1c, Weight, HTN med adjust.  If you have any other questions or concerns, please feel free to call the office or send a message through [place]. You may also schedule an earlier appointment if necessary.  Additionally, you may be receiving a survey about your experience at our office within a few days to 1 week by e-mail or mail. We value your feedback.  [name], DO [place]

## 2017-12-29 NOTE — Progress Notes (Signed)
Subjective:    Patient ID: Michael Taylor, male    DOB: [birth year], 47 y.o.   MRN: [id number]  Michael Taylor is a 47 y.o. male presenting on 12/29/2017 for Annual Exam   HPI   Here for Annual Physical and Lab Review.  FOLLOW-UPPre-Diabetes / Obesity BMI >37 Reports lifestyle improvements overall CBGs:Not checking Meds:Never on meds Currently on ARB for HTN Lifestyle: Down 7-8 lbs in 3 months Diet - Eliminated soda coke. Drinks mostly propel black cherry water, reduced portion size, avoid night snacks - Exercise: inc regular exercise activity, walking regularly - No known fam history DM  CHRONIC HTN: - Today patient reports he has not been checking BP regularly Current Meds -Losartan 50mg daily Reports good compliance now much improved adherence. Tolerating well w/o side effect  FOLLOW-UP Plantar fasciitis, bilateral (R>L) Today no new concerns. See prior note for background. doing well, no more aching. Able to run and exercise better now.  Additonal complaint - Right flank lipoma,  Large abnormality present for many years, seems may have grown in size some, prior evaluated by Dermatology Dr Michael Taylor and was reassured. No new concerns except may be slightly larger, otherwise non tender no other skin problem or other abnormality  Health Maintenance:  Declined Flu Shot this season.  Prostate CA Screening. Last PSA 0.6 (12/2017). Currently asymptomatic except rarely has nocturia x 1. Known family history of prostate CA, Father age 8+ prostate CA treated with radiation, he has two brothers without prostate problem.  Colonoscopy 2011- will order at next visit 6 months, referral at that time. Less interested in other screening methods. No significant known fam history of colon CA.  Depression screen [facility] 2/9 12/29/2017 08/21/2017 04/24/2017  Decreased Interest 0 0 0  Down, Depressed, Hopeless 0 0 0  PHQ - 2 Score 0 0 0    Past Medical History:  Diagnosis Date  .  Plantar fasciitis, bilateral    right greater that left,  diagnosed by Dr.  Claris Taylor   Past Surgical History:  Procedure Laterality Date  . HIP SURGERY     right,  [facility] bone graft  . MECKEL DIVERTICULUM EXCISION  2011   Michael Taylor  . SHOULDER DEBRIDEMENT     [facility]   Social History   Socioeconomic History  . Marital status: Married    Spouse name: Not on file  . Number of children: 3  . 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 Highest education level: Not on file  Occupational History  . Occupation: Forensic psychologist (Engineer, building services)    Comment: Practices locally in [place]  . Financial resource strain: Not on file  . Food insecurity:    Worry: Not on file    Inability: Not on file  . Transportation needs:    Medical: Not on file    Non-medical: Not on file  Tobacco Use  . Smoking status: Never Smoker  . Smokeless tobacco: Never Used  Substance and Sexual Activity  . Alcohol use: Yes    Alcohol/week: 4.0 standard drinks    Types: 4 Shots of liquor per week    Comment: rare, < 1 x monthly  . Drug use: No  . Sexual activity: Not on file  Lifestyle  . Physical activity:    Days per week: Not on file    Minutes per session: Not on file  . Stress: Not on file  Relationships  . Social connections:    Talks on phone: Not on  file    Gets together: Not on file    Attends religious service: Not on file    Active member of club or organization: Not on file    Attends meetings of clubs or organizations: Not on file    Relationship status: Not on file  . Intimate partner violence:    Fear of current or ex partner: Not on file    Emotionally abused: Not on file    Physically abused: Not on file    Forced sexual activity: Not on file  Other Topics Concern  . Not on file  Social History Narrative  . Not on file   Family History  Problem Relation Age of Onset  . Arthritis Mother   . Hypertension Mother   . Arthritis Father        knee replacement  .  Hypertension Father   . Heart disease Father 9  . Heart attack Father 61  . Prostate cancer Father 5       radiation treatment successful  . Colon cancer Neg Hx    Current Outpatient Medications on File Prior to Visit  Medication Sig  . loratadine (CLARITIN) 10 MG tablet Take 10 mg by mouth daily.  Marland Kitchen losartan (COZAAR) 50 MG tablet Take 1 tablet (50 mg total) by mouth daily.  Marland Kitchen omeprazole (PRILOSEC) 20 MG capsule Take 1 capsule (20 mg total) by mouth daily.   No current facility-administered medications on file prior to visit.     Review of Systems  Constitutional: Negative for activity change, appetite change, chills, diaphoresis, fatigue and fever.  HENT: Negative for congestion and hearing loss.   Eyes: Negative for visual disturbance.       Difficulty focusing only - no vision loss  Respiratory: Negative for apnea, cough, choking, chest tightness, shortness of breath and wheezing.   Cardiovascular: Negative for chest pain, palpitations and leg swelling.  Gastrointestinal: Negative for abdominal pain, anal bleeding, blood in stool, constipation, diarrhea, nausea and vomiting.  Endocrine: Negative for cold intolerance.  Genitourinary: Negative for decreased urine volume, difficulty urinating, dysuria, frequency and hematuria.  Musculoskeletal: Negative for arthralgias, back pain and neck pain.  Skin: Negative for rash.  Allergic/Immunologic: Negative for environmental allergies.  Neurological: Negative for dizziness, weakness, light-headedness, numbness and headaches.  Hematological: Negative for adenopathy.  Psychiatric/Behavioral: Negative for behavioral problems, dysphoric mood and sleep disturbance. The patient is not nervous/anxious.    Per HPI unless specifically indicated above     Objective:    BP 134/80 (BP Location: Left Arm, Cuff Size: Normal)   Pulse 87   Temp 98.4 F (36.9 C) (Oral)   Resp 16   Ht 5\' 9"  (1.753 m)   Wt 257 lb (116.6 kg)   BMI 37.95 kg/m     Wt Readings from Last 3 Encounters:  12/29/17 257 lb (116.6 kg)  08/21/17 264 lb (119.7 kg)  04/24/17 261 lb (118.4 kg)    Physical Exam  Constitutional: He is oriented to person, place, and time. He appears well-developed and well-nourished. No distress.  Well-appearing, comfortable, cooperative  HENT:  Head: Normocephalic and atraumatic.  Mouth/Throat: Oropharynx is clear and moist.  Frontal / maxillary sinuses non-tender. Nares patent without purulence or edema. Bilateral TMs clear without erythema, effusion or bulging. Oropharynx clear without erythema, exudates, edema or asymmetry.  Eyes: Pupils are equal, round, and reactive to light. Conjunctivae and EOM are normal. Right eye exhibits no discharge. Left eye exhibits no discharge.  Neck: Normal range of motion.  Neck supple. No thyromegaly present.  Cardiovascular: Normal rate, regular rhythm, normal heart sounds and intact distal pulses.  No murmur heard. Pulmonary/Chest: Effort normal and breath sounds normal. No respiratory distress. He has no wheezes. He has no rales.  Abdominal: Soft. Bowel sounds are normal. He exhibits no distension and no mass. There is no tenderness.  Musculoskeletal: Normal range of motion. He exhibits no edema or tenderness.  Upper / Lower Extremities: - Normal muscle tone, strength bilateral upper extremities 5/5, lower extremities 5/5  Right Flank with large palpable fullness with raised area consistent with possible lipoma, unchanged in few years. Non tender no erythema, no nodular density  Lymphadenopathy:    He has no cervical adenopathy.  Neurological: He is alert and oriented to person, place, and time.  Distal sensation intact to light touch all extremities  Skin: Skin is warm and dry. No rash noted. He is not diaphoretic. No erythema.  Psychiatric: He has a normal mood and affect. His behavior is normal.  Well groomed, good eye contact, normal speech and thoughts  Nursing note and vitals  reviewed.  Results for orders placed or performed in visit on 12/24/17  PSA, Total with Reflex to PSA, Free  Result Value Ref Range   PSA, Total 0.6 < OR = 4.0 ng/mL  Lipid panel  Result Value Ref Range   Cholesterol 136 <200 mg/dL   HDL 37 (L) >40 mg/dL   Triglycerides 179 (H) <150 mg/dL   LDL Cholesterol (Calc) 73 mg/dL (calc)   Total CHOL/HDL Ratio 3.7 <5.0 (calc)   Non-HDL Cholesterol (Calc) 99 <130 mg/dL (calc)  COMPLETE METABOLIC PANEL WITH GFR  Result Value Ref Range   Glucose, Bld 104 (H) 65 - 99 mg/dL   BUN 11 7 - 25 mg/dL   Creat 0.93 0.60 - 1.35 mg/dL   GFR, Est Non African American 96 > OR = 60 mL/min/1.64m2   GFR, Est African American 111 > OR = 60 mL/min/1.23m2   BUN/Creatinine Ratio NOT APPLICABLE 6 - 22 (calc)   Sodium 140 135 - 146 mmol/L   Potassium 4.2 3.5 - 5.3 mmol/L   Chloride 104 98 - 110 mmol/L   CO2 30 20 - 32 mmol/L   Calcium 9.0 8.6 - 10.3 mg/dL   Total Protein 6.8 6.1 - 8.1 g/dL   Albumin 4.1 3.6 - 5.1 g/dL   Globulin 2.7 1.9 - 3.7 g/dL (calc)   AG Ratio 1.5 1.0 - 2.5 (calc)   Total Bilirubin 0.8 0.2 - 1.2 mg/dL   Alkaline phosphatase (APISO) 61 40 - 115 U/L   AST 24 10 - 40 U/L   ALT 34 9 - 46 U/L  CBC with Differential/Platelet  Result Value Ref Range   WBC 5.1 3.8 - 10.8 Thousand/uL   RBC 5.35 4.20 - 5.80 Million/uL   Hemoglobin 14.9 13.2 - 17.1 g/dL   HCT 43.8 38.5 - 50.0 %   MCV 81.9 80.0 - 100.0 fL   MCH 27.9 27.0 - 33.0 pg   MCHC 34.0 32.0 - 36.0 g/dL   RDW 15.2 (H) 11.0 - 15.0 %   Platelets 135 (L) 140 - 400 Thousand/uL   MPV 11.8 7.5 - 12.5 fL   Neutro Abs 3,045 1,500 - 7,800 cells/uL   Lymphs Abs 1,489 850 - 3,900 cells/uL   WBC mixed population 413 200 - 950 cells/uL   Eosinophils Absolute 112 15 - 500 cells/uL   Basophils Absolute 41 0 - 200 cells/uL   Neutrophils Relative %  59.7 %   Total Lymphocyte 29.2 %   Monocytes Relative 8.1 %   Eosinophils Relative 2.2 %   Basophils Relative 0.8 %  Hemoglobin A1c  Result Value  Ref Range   Hgb A1c MFr Bld 5.8 (H) <5.7 % of total Hgb   Mean Plasma Glucose 120 (calc)   eAG (mmol/L) 6.6 (calc)      Assessment & Plan:   Problem List Items Addressed This Visit    Essential hypertension    Significantly improved DBP now on ARB therapy - however slightly elevated SBP today not as concerning Prior BP monitoring was elevated. Has not repeated monitoring since med No known complications. At risk with PreDM    Plan:  1. Continue Losartan 50mg  daily 2. CONTINUE Encourage improved lifestyle - low sodium diet also low carb vs DASH diet, improve regular exercise - goal wt loss 3. Continue monitor BP outside office at work, bring readings to next visit, if persistently >135/85 or new symptoms notify office sooner 4. Follow-up 6 months - agree to continue same dose for now, discussed if BP still near >135/85 will inc dose vs add 2nd agent - likely 1.5 pills or 2 pills for 75 to 100mg  of Losartan      Lipoma of abdominal wall    Clinically seems stable without significant changes or new concerns Monitor - in future may return to Derm otherwise due to size unlikely intervention      Mixed hyperlipidemia    Mostly controlled cholesterol improved lifestyle Last lipid panel 12/2017 Calculated ASCVD 10 yr risk score 2-4% (based on HTN)  Plan: 1. Reviewed ASCVD risk - for now he is at appropriate risk level, no indication for ASA / Statin currently 2. Encourage improved lifestyle - low carb/cholesterol, reduce portion size, start regular exercise once foot pain improved      Obesity (BMI 30-39.9)    Reassuring weight loss 7- 8 lbs in 4 months Improved lifestyle diet and activity Follow-up as planned q 6 month check up, A1c in 6 months      Pre-diabetes    Stable to PreDM with A1c 5.8 from 5.9 Improving lifestyle wt loss Concern with obesity, HTN  Plan:  1. Not on any therapy currently 2. Encourage continued improved lifestyle - low carb, low sugar diet, reduce  portion size, continue to improve regular exercise goal wt loss 3. Follow-up 6 months visit POC A1c      Prostate cancer screening    Negative PSA Asymptomatic Reassurance - continue yearly surveillance with known fam history and nearly age >61       Other Visit Diagnoses    Annual physical exam    -  Primary      Updated Health Maintenance information- Reviewed recent lab results with patient Encouraged improvement to lifestyle with diet and exercise - Goal of weight loss   No orders of the defined types were placed in this encounter.  Follow up plan: Return in about 6 months (around 07/01/2018) for PreDM A1c, Weight, HTN med adjust.  [name], DO [facility] 12/29/2017, 7:09 PM

## 2017-12-30 ENCOUNTER — Encounter: Payer: Self-pay | Admitting: Family Medicine

## 2017-12-30 NOTE — Assessment & Plan Note (Signed)
Stable to PreDM with A1c 5.8 from 5.9 Improving lifestyle wt loss Concern with obesity, HTN  Plan:  1. Not on any therapy currently 2. Encourage continued improved lifestyle - low carb, low sugar diet, reduce portion size, continue to improve regular exercise goal wt loss 3. Follow-up 6 months visit POC A1c

## 2017-12-30 NOTE — Assessment & Plan Note (Signed)
Mostly controlled cholesterol improved lifestyle Last lipid panel 12/2017 Calculated ASCVD 10 yr risk score 2-4% (based on HTN)  Plan: 1. Reviewed ASCVD risk - for now he is at appropriate risk level, no indication for ASA / Statin currently 2. Encourage improved lifestyle - low carb/cholesterol, reduce portion size, start regular exercise once foot pain improved

## 2017-12-30 NOTE — Assessment & Plan Note (Signed)
Negative PSA Asymptomatic Reassurance - continue yearly surveillance with known fam history and nearly age >26

## 2017-12-30 NOTE — Assessment & Plan Note (Signed)
Reassuring weight loss 7- 8 lbs in 4 months Improved lifestyle diet and activity Follow-up as planned q 6 month check up, A1c in 6 months

## 2017-12-30 NOTE — Assessment & Plan Note (Signed)
Significantly improved DBP now on ARB therapy - however slightly elevated SBP today not as concerning Prior BP monitoring was elevated. Has not repeated monitoring since med No known complications. At risk with PreDM    Plan:  1. Continue Losartan 50mg  daily 2. CONTINUE Encourage improved lifestyle - low sodium diet also low carb vs DASH diet, improve regular exercise - goal wt loss 3. Continue monitor BP outside office at work, bring readings to next visit, if persistently >135/85 or new symptoms notify office sooner 4. Follow-up 6 months - agree to continue same dose for now, discussed if BP still near >135/85 will inc dose vs add 2nd agent - likely 1.5 pills or 2 pills for 75 to 100mg  of Losartan

## 2017-12-30 NOTE — Assessment & Plan Note (Signed)
Clinically seems stable without significant changes or new concerns Monitor - in future may return to Derm otherwise due to size unlikely intervention

## 2018-01-21 ENCOUNTER — Encounter: Payer: Self-pay | Admitting: Nurse Practitioner

## 2018-01-21 ENCOUNTER — Ambulatory Visit (INDEPENDENT_AMBULATORY_CARE_PROVIDER_SITE_OTHER): Payer: BLUE CROSS/BLUE SHIELD | Admitting: Nurse Practitioner

## 2018-01-21 ENCOUNTER — Other Ambulatory Visit: Payer: Self-pay

## 2018-01-21 VITALS — BP 142/64 | HR 85 | Temp 98.2°F | Ht 69.0 in | Wt 256.0 lb

## 2018-01-21 DIAGNOSIS — M62830 Muscle spasm of back: Secondary | ICD-10-CM

## 2018-01-21 MED ORDER — BACLOFEN 10 MG PO TABS: 10.0000 mg | ORAL_TABLET | Freq: Three times a day (TID) | ORAL | 0 refills | Status: DC

## 2018-01-21 NOTE — Patient Instructions (Addendum)
Michael Taylor,   Thank you for coming in to clinic today.  1. You have a muscle strain. - Start taking Tylenol extra strength 1 to 2 tablets every 6-8 hours for aches or fever/chills for next few days as needed.  Do not take more than 3,000 mg in 24 hours from all medicines.  May take Ibuprofen as well if tolerated 400mg  every 8 hours as needed. May alternate tylenol and ibuprofen in same day. - Use heat and ice.  Apply this for 15 minutes at a time 6-8 times per day.   - Muscle rub with lidocaine, lidocaine patch, Biofreeze, or tiger balm for topical pain relief.  Avoid using this with heat and ice to avoid burns. - START muscle relaxer baclofen 10 mg one tablet up to three times daily.  This can cause drowsiness, so use caution.  It may be best to only take this at night for helping you during sleep.  Please schedule a follow-up appointment with Cassell Smiles, AGNP. Return 4-6 weeks if symptoms worsen or fail to improve.  If you have any other questions or concerns, please feel free to call the clinic or send a message through [place]. You may also schedule an earlier appointment if necessary.  You will receive a survey after today's visit either digitally by e-mail or paper by [employer]. Your experiences and feedback matter to [place].  Please respond so we know how we are doing as we provide care for you.   Cassell Smiles, DNP, AGNP-BC Adult Gerontology Nurse Practitioner [facility], [facility]  Low Back Pain Exercises See other page with pictures of each exercise.  Start with 1 or 2 of these exercises that you are most comfortable with. Do not do any exercises that cause you significant worsening pain. Some of these may cause some "stretching soreness" but it should go away after you stop the exercise, and get better over time. Gradually increase up to 3-4 exercises as tolerated.  Standing hamstring stretch: Place the heel of your leg on a stool about 15 inches high. Keep  your knee straight. Lean forward, bending at the hips until you feel a mild stretch in the back of your thigh. Make sure you do not roll your shoulders and bend at the waist when doing this or you will stretch your lower back instead. Hold the stretch for 15 to 30 seconds. Repeat 3 times. Repeat the same stretch on your other leg.  Cat and camel: Get down on your hands and knees. Let your stomach sag, allowing your back to curve downward. Hold this position for 5 seconds. Then arch your back and hold for 5 seconds. Do 3 sets of 10.  Quadriped Arm/Leg Raises: Get down on your hands and knees. Tighten your abdominal muscles to stiffen your spine. While keeping your abdominals tight, raise one arm and the opposite leg away from you. Hold this position for 5 seconds. Lower your arm and leg slowly and alternate sides. Do this 10 times on each side.  Pelvic tilt: Lie on your back with your knees bent and your feet flat on the floor. Tighten your abdominal muscles and push your lower back into the floor. Hold this position for 5 seconds, then relax. Do 3 sets of 10.  Partial curl: Lie on your back with your knees bent and your feet flat on the floor. Tighten your stomach muscles and flatten your back against the floor. Tuck your chin to your chest. With your  hands stretched out in front of you, curl your upper body forward until your shoulders clear the floor. Hold this position for 3 seconds. Don't hold your breath. It helps to breathe out as you lift your shoulders up. Relax. Repeat 10 times. Build to 3 sets of 10. To challenge yourself, clasp your hands behind your head and keep your elbows out to the side.  Lower trunk rotation: Lie on your back with your knees bent and your feet flat on the floor. Tighten your abdominal muscles and push your lower back into the floor. Keeping your shoulders down flat, gently rotate your legs to one side, then the other as far as you can. Repeat 10 to 20 times.  Single  knee to chest stretch: Lie on your back with your legs straight out in front of you. Bring one knee up to your chest and grasp the back of your thigh. Pull your knee toward your chest, stretching your buttock muscle. Hold this position for 15 to 30 seconds and return to the starting position. Repeat 3 times on each side.  Double knee to chest: Lie on your back with your knees bent and your feet flat on the floor. Tighten your abdominal muscles and push your lower back into the floor. Pull both knees up to your chest. Hold for 5 seconds and repeat 10 to 20 times.

## 2018-01-21 NOTE — Progress Notes (Signed)
Subjective:    Patient ID: Michael Taylor, male    DOB: 1971-02-02, 47 y.o.   MRN: [id number]  Michael Taylor is a 47 y.o. male presenting on 01/21/2018 for Back Pain (RLQ back pain x 4 days. Pain worsen with movement. )   HPI Back Pain Onset of pain 4 days ago without sudden or sharp onset of pain.  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  No trouble with bending over.  Mor epain with straightening up.  No prior back problems.  Regularly sleeps on stomach. Pain level so high at times, he sweats. This occurs most often with back extension/straightnening.  - On Mon, Tues, Wed evenings has tried heating pad without relief.  Ibuprofen 2-3 times daily has provided some, but minimal relief. - Generally stays active when outside of work with his children.   Social History   Tobacco Use  . Smoking status: Never Smoker  . Smokeless tobacco: Never Used  Substance Use Topics  . Alcohol use: Yes    Alcohol/week: 4.0 standard drinks    Types: 4 Shots of liquor per week    Comment: rare, < 1 x monthly  . Drug use: No    Review of Systems Per HPI unless specifically indicated above     Objective:    BP (!) 142/64 (BP Location: Left Arm, Patient Position: Sitting, Cuff Size: Large)   Pulse 85   Temp 98.2 F (36.8 C) (Oral)   Ht 5\' 9"  (1.753 m)   Wt 256 lb (116.1 kg)   BMI 37.80 kg/m    Wt Readings from Last 3 Encounters:  01/21/18 256 lb (116.1 kg)  12/29/17 257 lb (116.6 kg)  08/21/17 264 lb (119.7 kg)    Physical Exam  Constitutional: He is oriented to person, place, and time. He appears well-developed and well-nourished. No distress.  HENT:  Head: Normocephalic and atraumatic.  Musculoskeletal:  Low Back Inspection: Normal appearance, Large body habitus, no spinal deformity, symmetrical. Palpation: No tenderness over spinous processes. Right sided lumbar paraspinal muscles  tender and with hypertonicity.  Bilateral lumbar paraspinal muscles with hypertonicity. ROM: Full AROM forward flex /Left rotation without discomfort.  Limited AROM back extension, rotation R with pain Special Testing: Seated SLR negative for radicular pain bilaterally, but with reproduced localized R back pain. Strength: Bilateral hip flex/ext 5/5, knee flex/ext 5/5, ankle dorsiflex/plantarflex 5/5 Neurovascular: intact distal sensation to light touch   Neurological: He is alert and oriented to person, place, and time.  Skin: Skin is warm. Capillary refill takes less than 2 seconds. He is diaphoretic.  Psychiatric: He has a normal mood and affect. His behavior is normal. Judgment and thought content normal.  Vitals reviewed.  Results for orders placed or performed in visit on 12/24/17  PSA, Total with Reflex to PSA, Free  Result Value Ref Range   PSA, Total 0.6 < OR = 4.0 ng/mL  Lipid panel  Result Value Ref Range   Cholesterol 136 <200 mg/dL   HDL 37 (L) >40 mg/dL   Triglycerides 179 (H) <150 mg/dL   LDL Cholesterol (Calc) 73 mg/dL (calc)   Total CHOL/HDL Ratio 3.7 <5.0 (calc)   Non-HDL Cholesterol (Calc) 99 <130 mg/dL (calc)  COMPLETE METABOLIC PANEL WITH GFR  Result Value Ref Range   Glucose, Bld 104 (H) 65 - 99 mg/dL   BUN 11 7 -  25 mg/dL   Creat 0.93 0.60 - 1.35 mg/dL   GFR, Est Non African American 96 > OR = 60 mL/min/1.41m2   GFR, Est African American 111 > OR = 60 mL/min/1.33m2   BUN/Creatinine Ratio NOT APPLICABLE 6 - 22 (calc)   Sodium 140 135 - 146 mmol/L   Potassium 4.2 3.5 - 5.3 mmol/L   Chloride 104 98 - 110 mmol/L   CO2 30 20 - 32 mmol/L   Calcium 9.0 8.6 - 10.3 mg/dL   Total Protein 6.8 6.1 - 8.1 g/dL   Albumin 4.1 3.6 - 5.1 g/dL   Globulin 2.7 1.9 - 3.7 g/dL (calc)   AG Ratio 1.5 1.0 - 2.5 (calc)   Total Bilirubin 0.8 0.2 - 1.2 mg/dL   Alkaline phosphatase (APISO) 61 40 - 115 U/L   AST 24 10 - 40 U/L   ALT 34 9 - 46 U/L  CBC with Differential/Platelet    Result Value Ref Range   WBC 5.1 3.8 - 10.8 Thousand/uL   RBC 5.35 4.20 - 5.80 Million/uL   Hemoglobin 14.9 13.2 - 17.1 g/dL   HCT 43.8 38.5 - 50.0 %   MCV 81.9 80.0 - 100.0 fL   MCH 27.9 27.0 - 33.0 pg   MCHC 34.0 32.0 - 36.0 g/dL   RDW 15.2 (H) 11.0 - 15.0 %   Platelets 135 (L) 140 - 400 Thousand/uL   MPV 11.8 7.5 - 12.5 fL   Neutro Abs 3,045 1,500 - 7,800 cells/uL   Lymphs Abs 1,489 850 - 3,900 cells/uL   WBC mixed population 413 200 - 950 cells/uL   Eosinophils Absolute 112 15 - 500 cells/uL   Basophils Absolute 41 0 - 200 cells/uL   Neutrophils Relative % 59.7 %   Total Lymphocyte 29.2 %   Monocytes Relative 8.1 %   Eosinophils Relative 2.2 %   Basophils Relative 0.8 %  Hemoglobin A1c  Result Value Ref Range   Hgb A1c MFr Bld 5.8 (H) <5.7 % of total Hgb   Mean Plasma Glucose 120 (calc)   eAG (mmol/L) 6.6 (calc)      Assessment & Plan:   Problem List Items Addressed This Visit    None    Visit Diagnoses    Lumbar paraspinal muscle spasm    -  Primary   Relevant Medications   baclofen (LIORESAL) 10 MG tablet    Pain likely self-limited.  Muscle strain possible complicated by yardwork and stomach sleeping.  Plan:  1. Treat with OTC pain meds (acetaminophen and ibuprofen).  Discussed alternate dosing and max dosing. 2. Apply heat and/or ice to affected area. 3. May also apply a muscle rub with lidocaine or lidocaine patch after heat or ice. 4. Take muscle relaxer baclofen 10 mg up to three times daily.  Cautioned drowsiness. 5. Avoid sleeping on back as this places most stress on back. 6. Follow up 4-6 weeks prn if pain persists.    Meds ordered this encounter  Medications  . baclofen (LIORESAL) 10 MG tablet    Sig: Take 1 tablet (10 mg total) by mouth 3 (three) times daily.    Dispense:  30 each    Refill:  0    Order Specific Question:   Supervising Provider    Answer:   Olin Hauser [[id number]]   Follow up plan: Return 4-6 weeks if symptoms  worsen or fail to improve.  Cassell Smiles, DNP, AGPCNP-BC Adult Gerontology Primary Care Nurse Practitioner [facility] 01/21/2018, 4:11 PM

## 2018-07-02 ENCOUNTER — Ambulatory Visit: Payer: BLUE CROSS/BLUE SHIELD | Admitting: Family Medicine

## 2018-08-30 ENCOUNTER — Other Ambulatory Visit: Payer: Self-pay | Admitting: Family Medicine

## 2018-08-30 DIAGNOSIS — I10 Essential (primary) hypertension: Secondary | ICD-10-CM

## 2018-10-20 ENCOUNTER — Other Ambulatory Visit: Payer: Self-pay | Admitting: Orthopedic Surgery

## 2018-10-20 DIAGNOSIS — M25362 Other instability, left knee: Secondary | ICD-10-CM

## 2018-10-20 DIAGNOSIS — M2352 Chronic instability of knee, left knee: Secondary | ICD-10-CM

## 2018-10-20 DIAGNOSIS — M2392 Unspecified internal derangement of left knee: Secondary | ICD-10-CM

## 2018-10-20 DIAGNOSIS — M1712 Unilateral primary osteoarthritis, left knee: Secondary | ICD-10-CM

## 2018-10-20 DIAGNOSIS — G8929 Other chronic pain: Secondary | ICD-10-CM

## 2018-10-30 ENCOUNTER — Ambulatory Visit: Payer: BC Managed Care – PPO

## 2019-06-13 ENCOUNTER — Other Ambulatory Visit: Payer: Self-pay | Admitting: Family Medicine

## 2019-06-13 DIAGNOSIS — I10 Essential (primary) hypertension: Secondary | ICD-10-CM

## 2019-06-13 MED ORDER — LOSARTAN POTASSIUM 50 MG PO TABS: 50.0000 mg | ORAL_TABLET | Freq: Every day | ORAL | 0 refills | Status: DC

## 2019-07-05 ENCOUNTER — Other Ambulatory Visit: Payer: Self-pay | Admitting: Family Medicine

## 2019-07-05 DIAGNOSIS — I10 Essential (primary) hypertension: Secondary | ICD-10-CM

## 2019-08-03 ENCOUNTER — Other Ambulatory Visit: Payer: Self-pay | Admitting: Family Medicine

## 2019-08-03 DIAGNOSIS — I10 Essential (primary) hypertension: Secondary | ICD-10-CM

## 2019-08-04 NOTE — Telephone Encounter (Signed)
Last seen 12/2017. I have declined last few refills.  He needs an office visit for more refills.  If you can call him that would be great. Let me know if he schedules, then I can send in at least a 30 day supply to get him to that appointment.  [name], [facility] 08/04/2019, 8:10 AM

## 2019-09-06 ENCOUNTER — Other Ambulatory Visit: Payer: Self-pay | Admitting: Family Medicine

## 2019-09-06 DIAGNOSIS — I10 Essential (primary) hypertension: Secondary | ICD-10-CM

## 2019-09-06 NOTE — Telephone Encounter (Signed)
10 day refill No further refills until office appt Requested Prescriptions  Pending Prescriptions Disp Refills  . losartan (COZAAR) 50 MG tablet [Pharmacy Med Name: LOSARTAN POTASSIUM 50 MG TAB] 10 tablet 0    Sig: TAKE 1 TABLET (50 MG TOTAL) BY MOUTH DAILY. NEED APT FOR FURTHER REFILLS     Cardiovascular:  Angiotensin Receptor Blockers Failed - 09/06/2019  8:33 AM      Failed - Cr in normal range and within 180 days    Creat  Date Value Ref Range Status  12/24/2017 0.93 0.60 - 1.35 mg/dL Final         Failed - K in normal range and within 180 days    Potassium  Date Value Ref Range Status  12/24/2017 4.2 3.5 - 5.3 mmol/L Final         Failed - Last BP in normal range    BP Readings from Last 1 Encounters:  01/21/18 (!) 142/64         Failed - Valid encounter within last 6 months    Recent Outpatient Visits          1 year ago Lumbar paraspinal muscle spasm   [facility] Mikey College, NP   1 year ago Annual physical exam   [facility] Olin Hauser, DO   2 years ago [place], Devonne Doughty, DO   2 years ago Pre-diabetes   [place], Devonne Doughty, DO   2 years ago Annual physical exam   [place], Devonne Doughty, DO      Future Appointments            Tomorrow [place], [facility], [place] - Patient is not pregnant

## 2019-09-07 ENCOUNTER — Other Ambulatory Visit: Payer: Self-pay | Admitting: Family Medicine

## 2019-09-07 ENCOUNTER — Ambulatory Visit (INDEPENDENT_AMBULATORY_CARE_PROVIDER_SITE_OTHER): Payer: BC Managed Care – PPO | Admitting: Family Medicine

## 2019-09-07 ENCOUNTER — Other Ambulatory Visit: Payer: Self-pay

## 2019-09-07 ENCOUNTER — Encounter: Payer: Self-pay | Admitting: Family Medicine

## 2019-09-07 VITALS — BP 165/93 | HR 89 | Temp 96.9°F | Resp 16 | Ht 69.0 in | Wt 273.6 lb

## 2019-09-07 DIAGNOSIS — R7303 Prediabetes: Secondary | ICD-10-CM | POA: Diagnosis not present

## 2019-09-07 DIAGNOSIS — I10 Essential (primary) hypertension: Secondary | ICD-10-CM

## 2019-09-07 DIAGNOSIS — M159 Polyosteoarthritis, unspecified: Secondary | ICD-10-CM | POA: Insufficient documentation

## 2019-09-07 DIAGNOSIS — M722 Plantar fascial fibromatosis: Secondary | ICD-10-CM | POA: Diagnosis not present

## 2019-09-07 DIAGNOSIS — Z0000 Encounter for general adult medical examination without abnormal findings: Secondary | ICD-10-CM

## 2019-09-07 DIAGNOSIS — M65332 Trigger finger, left middle finger: Secondary | ICD-10-CM | POA: Diagnosis not present

## 2019-09-07 DIAGNOSIS — E782 Mixed hyperlipidemia: Secondary | ICD-10-CM

## 2019-09-07 DIAGNOSIS — Z6841 Body Mass Index (BMI) 40.0 and over, adult: Secondary | ICD-10-CM

## 2019-09-07 DIAGNOSIS — M8949 Other hypertrophic osteoarthropathy, multiple sites: Secondary | ICD-10-CM

## 2019-09-07 DIAGNOSIS — R3915 Urgency of urination: Secondary | ICD-10-CM

## 2019-09-07 DIAGNOSIS — Z125 Encounter for screening for malignant neoplasm of prostate: Secondary | ICD-10-CM

## 2019-09-07 MED ORDER — LOSARTAN POTASSIUM 50 MG PO TABS: 50.0000 mg | ORAL_TABLET | Freq: Every day | ORAL | 1 refills | Status: DC

## 2019-09-07 MED ORDER — DICLOFENAC SODIUM 1 % EX GEL: 2.0000 g | Freq: Three times a day (TID) | CUTANEOUS | 3 refills | Status: DC | PRN

## 2019-09-07 NOTE — Assessment & Plan Note (Signed)
Abnormal Weight gain +20 lbs in about 2 years approximately Encourage lifestyle improvement diet exercise

## 2019-09-07 NOTE — Assessment & Plan Note (Signed)
Uncontrolled BP off medication now >1 month lost to follow-up - Home BP readings none available.  No known complications    Plan:  1. Continue current BP regimen - RESTART Losartan 50mg  daily - advised we may need to increase dose in future however to 75 to 100mg  or consider 2nd agent  2. Encourage improved lifestyle - low sodium diet, regular exercise - Reduce caffeine 3. Start Home monitor BP outside office, bring readings to next visit, if persistently >140/90 or new symptoms notify office sooner  F/u 4 weeks for labs / physical, review BP log

## 2019-09-07 NOTE — Assessment & Plan Note (Signed)
Stable chronic L middle finger triggering, mild without complications, still has good mobility of finger and not impacting his daily function Reassurance Treat hand OA/DJD now topical voltaren, avoid provoking repetitive activities if possible Consider future ortho consult in future as indicated

## 2019-09-07 NOTE — Progress Notes (Signed)
Subjective:    Patient ID: Michael Taylor, male    DOB: 08-May-1971, 49 y.o.   MRN: [id number]  Michael Taylor is a 49 y.o. male presenting on 09/07/2019 for Hypertension   HPI   CHRONIC HTN: - Today patient reports he has not been checking BP regularly, does not have cuff at home but can get one. Current Meds -Losartan 50mg daily (out of med for >1 month) Was tolerating well w/o side effect  FOLLOW-UP Plantar fasciitis, bilateral (R>L) / osteoarthritis multiple joints See prior note, had seen [facility] with injections and [facility] for knee Today reporting some bottom of foot numbness with pain worse at end of day Also he says worse foot pain, after prolong standing and weight is affecting it He has joint pain and stiffness in multiple areas including hands and fingers. He has a trigger finger Left middle finger with "sticking" when he bends it back and forth, doesn't hurt or cause major impact on his finger or hand mobility  Urinary Urgency / LUTS No formal history of BPH. If sitting down for a while usually not a problem He does a lot of hunting, if stand up will get an urge to go - Does take some frequent caffeine about 20oz coke 1-2 times a day,  No coffee. Occasional Sweet tea once daily. - Rare alcohol  AUA BPH Symptom Score over past 1 month 1. Sensation of not emptying bladder post void - 0 2. Urinate less than 2 hour after finish last void - 3 3. Start/Stop several times during void - 0 4. Difficult to postpone urination - 5 5. Weak urinary stream - 1 6. Push or strain urination - 1 7. Nocturia - 0-1 times  Total Score: 11 (Moderate BPH symptoms)   Health Maintenance:  Prostate CA Screening. Last PSA 0.6 (12/2017). Currently with some LUTS - see above. Known family history of prostate CA, Father age 22+ prostate CA treated with radiation, he has two brothers without prostate problem.   Depression screen [facility] 2/9 09/07/2019 12/29/2017 08/21/2017  Decreased  Interest 0 0 0  Down, Depressed, Hopeless 0 0 0  PHQ - 2 Score 0 0 0    Social History   Tobacco Use  . Smoking status: Never Smoker  . Smokeless tobacco: Never Used  Substance Use Topics  . Alcohol use: Yes    Alcohol/week: 4.0 standard drinks    Types: 4 Shots of liquor per week    Comment: rare, < 1 x monthly  . Drug use: No    Review of Systems Per HPI unless specifically indicated above     Objective:    BP (!) 165/93   Pulse 89   Temp (!) 96.9 F (36.1 C) (Temporal)   Resp 16   Ht 5\' 9"  (1.753 m)   Wt 273 lb 9.6 oz (124.1 kg)   BMI 40.40 kg/m   Wt Readings from Last 3 Encounters:  09/07/19 273 lb 9.6 oz (124.1 kg)  01/21/18 256 lb (116.1 kg)  12/29/17 257 lb (116.6 kg)    Physical Exam Vitals and nursing note reviewed.  Constitutional:      General: He is not in acute distress.    Appearance: He is well-developed. He is obese. He is not diaphoretic.     Comments: Well-appearing, comfortable, cooperative  HENT:     Head: Normocephalic and atraumatic.  Eyes:     General:        Right eye: No discharge.  Left eye: No discharge.     Conjunctiva/sclera: Conjunctivae normal.  Cardiovascular:     Rate and Rhythm: Normal rate.  Pulmonary:     Effort: Pulmonary effort is normal.  Skin:    General: Skin is warm and dry.     Findings: No erythema or rash.  Neurological:     Mental Status: He is alert and oriented to person, place, and time.  Psychiatric:        Behavior: Behavior normal.     Comments: Well groomed, good eye contact, normal speech and thoughts        Results for orders placed or performed in visit on 12/24/17  PSA, Total with Reflex to PSA, Free  Result Value Ref Range   PSA, Total 0.6 < OR = 4.0 ng/mL  Lipid panel  Result Value Ref Range   Cholesterol 136 <200 mg/dL   HDL 37 (L) >40 mg/dL   Triglycerides 179 (H) <150 mg/dL   LDL Cholesterol (Calc) 73 mg/dL (calc)   Total CHOL/HDL Ratio 3.7 <5.0 (calc)   Non-HDL  Cholesterol (Calc) 99 <130 mg/dL (calc)  COMPLETE METABOLIC PANEL WITH GFR  Result Value Ref Range   Glucose, Bld 104 (H) 65 - 99 mg/dL   BUN 11 7 - 25 mg/dL   Creat 0.93 0.60 - 1.35 mg/dL   GFR, Est Non African American 96 > OR = 60 mL/min/1.57m2   GFR, Est African American 111 > OR = 60 mL/min/1.54m2   BUN/Creatinine Ratio NOT APPLICABLE 6 - 22 (calc)   Sodium 140 135 - 146 mmol/L   Potassium 4.2 3.5 - 5.3 mmol/L   Chloride 104 98 - 110 mmol/L   CO2 30 20 - 32 mmol/L   Calcium 9.0 8.6 - 10.3 mg/dL   Total Protein 6.8 6.1 - 8.1 g/dL   Albumin 4.1 3.6 - 5.1 g/dL   Globulin 2.7 1.9 - 3.7 g/dL (calc)   AG Ratio 1.5 1.0 - 2.5 (calc)   Total Bilirubin 0.8 0.2 - 1.2 mg/dL   Alkaline phosphatase (APISO) 61 40 - 115 U/L   AST 24 10 - 40 U/L   ALT 34 9 - 46 U/L  CBC with Differential/Platelet  Result Value Ref Range   WBC 5.1 3.8 - 10.8 Thousand/uL   RBC 5.35 4.20 - 5.80 Million/uL   Hemoglobin 14.9 13.2 - 17.1 g/dL   HCT 43.8 38.5 - 50.0 %   MCV 81.9 80.0 - 100.0 fL   MCH 27.9 27.0 - 33.0 pg   MCHC 34.0 32.0 - 36.0 g/dL   RDW 15.2 (H) 11.0 - 15.0 %   Platelets 135 (L) 140 - 400 Thousand/uL   MPV 11.8 7.5 - 12.5 fL   Neutro Abs 3,045 1,500 - 7,800 cells/uL   Lymphs Abs 1,489 850 - 3,900 cells/uL   WBC mixed population 413 200 - 950 cells/uL   Eosinophils Absolute 112 15 - 500 cells/uL   Basophils Absolute 41 0 - 200 cells/uL   Neutrophils Relative % 59.7 %   Total Lymphocyte 29.2 %   Monocytes Relative 8.1 %   Eosinophils Relative 2.2 %   Basophils Relative 0.8 %  Hemoglobin A1c  Result Value Ref Range   Hgb A1c MFr Bld 5.8 (H) <5.7 % of total Hgb   Mean Plasma Glucose 120 (calc)   eAG (mmol/L) 6.6 (calc)      Assessment & Plan:   Problem List Items Addressed This Visit    Trigger finger, left middle finger  Stable chronic L middle finger triggering, mild without complications, still has good mobility of finger and not impacting his daily function Reassurance Treat  hand OA/DJD now topical voltaren, avoid provoking repetitive activities if possible Consider future ortho consult in future as indicated      Relevant Medications   diclofenac Sodium (VOLTAREN) 1 % GEL   Primary osteoarthritis involving multiple joints    Likely etiology multiple joints of body including hands / fingers, knees With known OA/DJD Worse with repetitive activities  Previously on meloxicam 15mg  daily PRN, off med for period of time, had it refilled now some PRN relief Trial on topical NSAID Diclofenac / Voltaren OTC, reviewed dosing and safety recommendations Future consider X-rays, other management as indicated      Relevant Medications   meloxicam (MOBIC) 15 MG tablet   diclofenac Sodium (VOLTAREN) 1 % GEL   Pre-diabetes    Due for re-check A1c with upcoming labs in 4 weeks Concern w weight gain      Plantar fasciitis, bilateral    Source of chronic foot pain and some neuropathy Previously with [facility] Dr Amalia Hailey had improved Now some flare, worse with weight gain      Relevant Medications   diclofenac Sodium (VOLTAREN) 1 % GEL   Morbid obesity with BMI of 40.0-44.9, adult (HCC)    Abnormal Weight gain +20 lbs in about 2 years approximately Encourage lifestyle improvement diet exercise      Essential hypertension - Primary    Uncontrolled BP off medication now >1 month lost to follow-up - Home BP readings none available.  No known complications    Plan:  1. Continue current BP regimen - RESTART Losartan 50mg  daily - advised we may need to increase dose in future however to 75 to 100mg  or consider 2nd agent  2. Encourage improved lifestyle - low sodium diet, regular exercise - Reduce caffeine 3. Start Home monitor BP outside office, bring readings to next visit, if persistently >140/90 or new symptoms notify office sooner  F/u 4 weeks for labs / physical, review BP log      Relevant Medications   losartan (COZAAR) 50 MG tablet      Meds ordered  this encounter  Medications  . losartan (COZAAR) 50 MG tablet    Sig: Take 1 tablet (50 mg total) by mouth daily.    Dispense:  90 tablet    Refill:  1  . diclofenac Sodium (VOLTAREN) 1 % GEL    Sig: Apply 2 g topically 3 (three) times daily as needed.    Dispense:  100 g    Refill:  3     Follow up plan: Return in about 4 weeks (around 10/05/2019) for Annual Physical.  Future labs ordered for 09/2019 A1c CMET LIPID CBC PSA TSH  [name], DO [facility] 09/07/2019, 2:47 PM

## 2019-09-07 NOTE — Assessment & Plan Note (Signed)
Due for re-check A1c with upcoming labs in 4 weeks Concern w weight gain

## 2019-09-07 NOTE — Assessment & Plan Note (Signed)
Likely etiology multiple joints of body including hands / fingers, knees With known OA/DJD Worse with repetitive activities  Previously on meloxicam 15mg  daily PRN, off med for period of time, had it refilled now some PRN relief Trial on topical NSAID Diclofenac / Voltaren OTC, reviewed dosing and safety recommendations Future consider X-rays, other management as indicated

## 2019-09-07 NOTE — Assessment & Plan Note (Signed)
Source of chronic foot pain and some neuropathy Previously with [facility] Dr Amalia Hailey had improved Now some flare, worse with weight gain

## 2019-09-07 NOTE — Patient Instructions (Addendum)
Thank you for coming to the office today.  Try Voltaren OTC gel 2-4 times a day as needed for joint or finger pain. I think you have a trigger finger L middle finger, for now as long as it functions fine, no treatment other than voltaren and meloxicam and avoid repetitive activity if possible.  Likely either bladder urgency or enlarged prostate. As discussed. Try to limit caffeine sweet tea / soda.  Try Saw Palmetto OTC herbal 80-160mg  1-2 times a day, regular dose - most days.  REstart Losartan 50mg  daily  Get a home BP cuff, check readings, write down a log, and we can review.  DUE for FASTING BLOOD WORK (no food or drink after midnight before the lab appointment, only water or coffee without cream/sugar on the morning of)  SCHEDULE "Lab Only" visit in the morning at the clinic for lab draw in 4 WEEKS   - Make sure Lab Only appointment is at about 1 week before your next appointment, so that results will be available  For Lab Results, once available within 2-3 days of blood draw, you can can log in to MyChart online to view your results and a brief explanation. Also, we can discuss results at next follow-up visit.    Please schedule a Follow-up Appointment to: Return in about 4 weeks (around 10/05/2019) for Annual Physical.  If you have any other questions or concerns, please feel free to call the office or send a message through [place]. You may also schedule an earlier appointment if necessary.  Additionally, you may be receiving a survey about your experience at our office within a few days to 1 week by e-mail or mail. We value your feedback.  [name], DO [place]

## 2019-10-03 ENCOUNTER — Other Ambulatory Visit: Payer: BC Managed Care – PPO

## 2019-10-03 ENCOUNTER — Other Ambulatory Visit: Payer: Self-pay

## 2019-10-03 DIAGNOSIS — Z0000 Encounter for general adult medical examination without abnormal findings: Secondary | ICD-10-CM

## 2019-10-03 DIAGNOSIS — I10 Essential (primary) hypertension: Secondary | ICD-10-CM

## 2019-10-03 DIAGNOSIS — R7303 Prediabetes: Secondary | ICD-10-CM

## 2019-10-03 DIAGNOSIS — R3915 Urgency of urination: Secondary | ICD-10-CM

## 2019-10-03 DIAGNOSIS — Z125 Encounter for screening for malignant neoplasm of prostate: Secondary | ICD-10-CM

## 2019-10-03 DIAGNOSIS — E782 Mixed hyperlipidemia: Secondary | ICD-10-CM

## 2019-10-04 LAB — HEMOGLOBIN A1C
Hgb A1c MFr Bld: 5.9 % of total Hgb — ABNORMAL HIGH (ref <5.7)
Mean Plasma Glucose: 123 (calc)
eAG (mmol/L): 6.8 (calc)

## 2019-10-04 LAB — COMPLETE METABOLIC PANEL WITH GFR
AG Ratio: 1.4 (calc) (ref 1.0–2.5)
ALT: 39 U/L (ref 9–46)
AST: 26 U/L (ref 10–35)
Albumin: 4.3 g/dL (ref 3.6–5.1)
Alkaline phosphatase (APISO): 63 U/L (ref 35–144)
BUN: 12 mg/dL (ref 7–25)
CO2: 31 mmol/L (ref 20–32)
Calcium: 9.5 mg/dL (ref 8.6–10.3)
Chloride: 103 mmol/L (ref 98–110)
Creat: 1.08 mg/dL (ref 0.70–1.33)
GFR, Est African American: 92 mL/min/{1.73_m2} (ref >=60)
GFR, Est Non African American: 79 mL/min/{1.73_m2} (ref >=60)
Globulin: 3 g/dL (calc) (ref 1.9–3.7)
Glucose, Bld: 122 mg/dL — ABNORMAL HIGH (ref 65–99)
Potassium: 4.5 mmol/L (ref 3.5–5.3)
Sodium: 142 mmol/L (ref 135–146)
Total Bilirubin: 0.8 mg/dL (ref 0.2–1.2)
Total Protein: 7.3 g/dL (ref 6.1–8.1)

## 2019-10-04 LAB — CBC WITH DIFFERENTIAL/PLATELET
Absolute Monocytes: 439 cells/uL (ref 200–950)
Basophils Absolute: 29 cells/uL (ref 0–200)
Basophils Relative: 0.5 %
Eosinophils Absolute: 182 cells/uL (ref 15–500)
Eosinophils Relative: 3.2 %
HCT: 48.1 % (ref 38.5–50.0)
Hemoglobin: 15.5 g/dL (ref 13.2–17.1)
Lymphs Abs: 1516 cells/uL (ref 850–3900)
MCH: 27.5 pg (ref 27.0–33.0)
MCHC: 32.2 g/dL (ref 32.0–36.0)
MCV: 85.4 fL (ref 80.0–100.0)
MPV: 10.7 fL (ref 7.5–12.5)
Monocytes Relative: 7.7 %
Neutro Abs: 3534 cells/uL (ref 1500–7800)
Neutrophils Relative %: 62 %
Platelets: 237 10*3/uL (ref 140–400)
RBC: 5.63 10*6/uL (ref 4.20–5.80)
RDW: 14.5 % (ref 11.0–15.0)
Total Lymphocyte: 26.6 %
WBC: 5.7 10*3/uL (ref 3.8–10.8)

## 2019-10-04 LAB — LIPID PANEL
Cholesterol: 167 mg/dL (ref <200)
HDL: 48 mg/dL (ref >=40)
LDL Cholesterol (Calc): 91 mg/dL (calc)
Non-HDL Cholesterol (Calc): 119 mg/dL (calc) (ref <130)
Total CHOL/HDL Ratio: 3.5 (calc) (ref <5.0)
Triglycerides: 189 mg/dL — ABNORMAL HIGH (ref <150)

## 2019-10-04 LAB — PSA: PSA: 0.8 ng/mL (ref <=4.0)

## 2019-10-04 LAB — TSH: TSH: 2.19 mIU/L (ref 0.40–4.50)

## 2019-10-05 ENCOUNTER — Encounter: Payer: Self-pay | Admitting: Family Medicine

## 2019-10-05 ENCOUNTER — Other Ambulatory Visit: Payer: Self-pay

## 2019-10-05 ENCOUNTER — Ambulatory Visit (INDEPENDENT_AMBULATORY_CARE_PROVIDER_SITE_OTHER): Payer: BC Managed Care – PPO | Admitting: Family Medicine

## 2019-10-05 VITALS — BP 140/90 | HR 86 | Temp 97.3°F | Resp 16 | Ht 69.0 in | Wt 272.6 lb

## 2019-10-05 DIAGNOSIS — Z8042 Family history of malignant neoplasm of prostate: Secondary | ICD-10-CM | POA: Diagnosis not present

## 2019-10-05 DIAGNOSIS — R7303 Prediabetes: Secondary | ICD-10-CM | POA: Diagnosis not present

## 2019-10-05 DIAGNOSIS — I10 Essential (primary) hypertension: Secondary | ICD-10-CM

## 2019-10-05 DIAGNOSIS — E782 Mixed hyperlipidemia: Secondary | ICD-10-CM

## 2019-10-05 DIAGNOSIS — Z0000 Encounter for general adult medical examination without abnormal findings: Secondary | ICD-10-CM

## 2019-10-05 DIAGNOSIS — Z1211 Encounter for screening for malignant neoplasm of colon: Secondary | ICD-10-CM

## 2019-10-05 DIAGNOSIS — Z6841 Body Mass Index (BMI) 40.0 and over, adult: Secondary | ICD-10-CM

## 2019-10-05 NOTE — Progress Notes (Signed)
Subjective:    Patient ID: Michael Taylor, male    DOB: 09-18-1970, 49 y.o.   MRN: [id number]  Michael Taylor is a 49 y.o. male presenting on 10/05/2019 for Annual Exam   HPI   Here for Annual Physical and Lab Review.  CHRONIC HTN: Interval update, he has Omron BP cuff at home now, checking readings has app on phone, BP readings R arm always checked, has readings 150-170 / 90-110. He says family member used cuff and it was normal. Back on med every day, no missed doses Current Meds -Losartan 50mg daily Reports good compliance, took meds today. Tolerating well, w/o complaints. Denies CP, dyspnea, HA, edema, dizziness / lightheadedness  Morbid Obesity BMI >40  HYPERLIPIDEMIA / HyperTriglyceridemia - Reports concerns. Last lipid panel 09/2019, elevated HyperTG, otherwise normal but increasing trend total chol and LDL Diet  Improving sodas and starches, breads limiting these now, still admits some work to do Not drinking alcohol Fam history HLD Triglycerides   Health Maintenance:  Prostate CA Screening. Last PSA0.8(09/2019, prior was 0.6 in 12/2017). Currently with some LUTS. Known family history of prostate CA, Father age 67+ prostate CA treated with radiation, he has two brothers without prostate problem.  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   Depression screen [facility] 2/9 09/07/2019 12/29/2017 08/21/2017  Decreased Interest 0 0 0  Down, Depressed, Hopeless 0 0 0  PHQ - 2 Score 0 0 0    Past Medical History:  Diagnosis Date  . Plantar fasciitis, bilateral    right greater that left,  diagnosed by Dr.  Claris Che   Past Surgical History:  Procedure Laterality Date  . HIP SURGERY     right,  [facility] bone graft  . MECKEL DIVERTICULUM EXCISION  2011   [name]  .  SHOULDER DEBRIDEMENT     [facility]   Social History   Socioeconomic History  . Marital status: Married    Spouse name: Not on file  . Number of children: 3  . 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 Highest education level: Not on file  Occupational History  . Occupation: Forensic psychologist (Engineer, building services)    Comment: Practices locally in [place]  Tobacco Use  . Smoking status: Never Smoker  . Smokeless tobacco: Never Used  Substance and Sexual Activity  . Alcohol use: Yes    Alcohol/week: 4.0 standard drinks    Types: 4 Shots of liquor per week    Comment: rare, < 1 x monthly  . Drug use: No  . Sexual activity: Not on file  Other Topics Concern  . Not on file  Social History Narrative  . Not on file   Social Determinants of Health   Financial Resource Strain:   . Difficulty of Paying Living Expenses:   Food Insecurity:   . Worried About Charity fundraiser in the Last Year:   . Arboriculturist in the Last Year:   Transportation Needs:   . Film/video editor (Medical):   Marland Kitchen Lack of Transportation (Non-Medical):   Physical Activity:   . Days of Exercise per Week:   . Minutes of Exercise per Session:   Stress:   . Feeling of Stress :   Social Connections:   . Frequency of Communication with Friends  and Family:   . Frequency of Social Gatherings with Friends and Family:   . Attends Religious Services:   . Active Member of Clubs or Organizations:   . Attends Archivist Meetings:   Marland Kitchen Marital Status:   Intimate Partner Violence:   . Fear of Current or Ex-Partner:   . Emotionally Abused:   Marland Kitchen Physically Abused:   . Sexually Abused:    Family History  Problem Relation Age of Onset  . Arthritis Mother   . Hypertension Mother   . Arthritis Father        knee replacement  . Hypertension Father   . Heart disease Father 46  . Heart attack Father 66  . Prostate cancer Father 96       radiation treatment successful  . Colon cancer Neg Hx    Current Outpatient  Medications on File Prior to Visit  Medication Sig  . diclofenac Sodium (VOLTAREN) 1 % GEL Apply 2 g topically 3 (three) times daily as needed.  . loratadine (CLARITIN) 10 MG tablet Take 10 mg by mouth daily.  Marland Kitchen losartan (COZAAR) 50 MG tablet Take 2 tablets (100 mg total) by mouth daily.  . meloxicam (MOBIC) 15 MG tablet Take 15 mg by mouth daily.  Marland Kitchen omeprazole (PRILOSEC) 20 MG capsule Take 1 capsule (20 mg total) by mouth daily.  . mupirocin ointment (BACTROBAN) 2 % Apply  a small amount  once a day  with dressing change   No current facility-administered medications on file prior to visit.    Review of Systems  Constitutional: Negative for activity change, appetite change, chills, diaphoresis, fatigue and fever.  HENT: Negative for congestion and hearing loss.   Eyes: Negative for visual disturbance.  Respiratory: Negative for apnea, cough, chest tightness, shortness of breath and wheezing.   Cardiovascular: Negative for chest pain, palpitations and leg swelling.  Gastrointestinal: Negative for abdominal pain, anal bleeding, blood in stool, constipation, diarrhea, nausea and vomiting.  Endocrine: Negative for cold intolerance.  Genitourinary: Negative for difficulty urinating, dysuria, frequency and hematuria.  Musculoskeletal: Negative for arthralgias, back pain and neck pain.  Skin: Negative for rash.  Allergic/Immunologic: Negative for environmental allergies.  Neurological: Negative for dizziness, weakness, light-headedness, numbness and headaches.  Hematological: Negative for adenopathy.  Psychiatric/Behavioral: Negative for behavioral problems, dysphoric mood and sleep disturbance. The patient is not nervous/anxious.    Per HPI unless specifically indicated above      Objective:    BP 140/90 (BP Location: Left Arm, Cuff Size: Large)   Pulse 86   Temp (!) 97.3 F (36.3 C) (Temporal)   Resp 16   Ht 5\' 9"  (1.753 m)   Wt 272 lb 9.6 oz (123.7 kg)   BMI 40.26 kg/m   Wt  Readings from Last 3 Encounters:  10/05/19 272 lb 9.6 oz (123.7 kg)  09/07/19 273 lb 9.6 oz (124.1 kg)  01/21/18 256 lb (116.1 kg)    Physical Exam Vitals and nursing note reviewed.  Constitutional:      General: He is not in acute distress.    Appearance: He is well-developed. He is obese. He is not diaphoretic.     Comments: Well-appearing, comfortable, cooperative  HENT:     Head: Normocephalic and atraumatic.  Eyes:     General:        Right eye: No discharge.        Left eye: No discharge.     Conjunctiva/sclera: Conjunctivae normal.     Pupils: Pupils are  equal, round, and reactive to light.  Neck:     Thyroid: No thyromegaly.  Cardiovascular:     Rate and Rhythm: Normal rate and regular rhythm.     Heart sounds: Normal heart sounds. No murmur.  Pulmonary:     Effort: Pulmonary effort is normal. No respiratory distress.     Breath sounds: Normal breath sounds. No wheezing or rales.  Abdominal:     General: Bowel sounds are normal. There is no distension.     Palpations: Abdomen is soft. There is no mass.     Tenderness: There is no abdominal tenderness.  Musculoskeletal:        General: No tenderness. Normal range of motion.     Cervical back: Normal range of motion and neck supple.     Right lower leg: No edema.     Left lower leg: No edema.     Comments: Upper / Lower Extremities: - Normal muscle tone, strength bilateral upper extremities 5/5, lower extremities 5/5  Lymphadenopathy:     Cervical: No cervical adenopathy.  Skin:    General: Skin is warm and dry.     Findings: No erythema or rash.  Neurological:     Mental Status: He is alert and oriented to person, place, and time.     Comments: Distal sensation intact to light touch all extremities  Psychiatric:        Behavior: Behavior normal.     Comments: Well groomed, good eye contact, normal speech and thoughts    Results for orders placed or performed in visit on 10/03/19  TSH  Result Value Ref Range    TSH 2.19 0.40 - 4.50 mIU/L  PSA  Result Value Ref Range   PSA 0.8 < OR = 4.0 ng/mL  Lipid panel  Result Value Ref Range   Cholesterol 167 <200 mg/dL   HDL 48 > OR = 40 mg/dL   Triglycerides 189 (H) <150 mg/dL   LDL Cholesterol (Calc) 91 mg/dL (calc)   Total CHOL/HDL Ratio 3.5 <5.0 (calc)   Non-HDL Cholesterol (Calc) 119 <130 mg/dL (calc)  COMPLETE METABOLIC PANEL WITH GFR  Result Value Ref Range   Glucose, Bld 122 (H) 65 - 99 mg/dL   BUN 12 7 - 25 mg/dL   Creat 1.08 0.70 - 1.33 mg/dL   GFR, Est Non African American 79 > OR = 60 mL/min/1.3m2   GFR, Est African American 92 > OR = 60 mL/min/1.7m2   BUN/Creatinine Ratio NOT APPLICABLE 6 - 22 (calc)   Sodium 142 135 - 146 mmol/L   Potassium 4.5 3.5 - 5.3 mmol/L   Chloride 103 98 - 110 mmol/L   CO2 31 20 - 32 mmol/L   Calcium 9.5 8.6 - 10.3 mg/dL   Total Protein 7.3 6.1 - 8.1 g/dL   Albumin 4.3 3.6 - 5.1 g/dL   Globulin 3.0 1.9 - 3.7 g/dL (calc)   AG Ratio 1.4 1.0 - 2.5 (calc)   Total Bilirubin 0.8 0.2 - 1.2 mg/dL   Alkaline phosphatase (APISO) 63 35 - 144 U/L   AST 26 10 - 35 U/L   ALT 39 9 - 46 U/L  CBC with Differential/Platelet  Result Value Ref Range   WBC 5.7 3.8 - 10.8 Thousand/uL   RBC 5.63 4.20 - 5.80 Million/uL   Hemoglobin 15.5 13.2 - 17.1 g/dL   HCT 48.1 38.5 - 50.0 %   MCV 85.4 80.0 - 100.0 fL   MCH 27.5 27.0 - 33.0 pg  MCHC 32.2 32.0 - 36.0 g/dL   RDW 14.5 11.0 - 15.0 %   Platelets 237 140 - 400 Thousand/uL   MPV 10.7 7.5 - 12.5 fL   Neutro Abs 3,534 1,500 - 7,800 cells/uL   Lymphs Abs 1,516 850 - 3,900 cells/uL   Absolute Monocytes 439 200 - 950 cells/uL   Eosinophils Absolute 182 15 - 500 cells/uL   Basophils Absolute 29 0 - 200 cells/uL   Neutrophils Relative % 62 %   Total Lymphocyte 26.6 %   Monocytes Relative 7.7 %   Eosinophils Relative 3.2 %   Basophils Relative 0.5 %  Hemoglobin A1c  Result Value Ref Range   Hgb A1c MFr Bld 5.9 (H) <5.7 % of total Hgb   Mean Plasma Glucose 123 (calc)    eAG (mmol/L) 6.8 (calc)      Assessment & Plan:   Problem List Items Addressed This Visit    Pre-diabetes    Elevated A1c still at 5.9, similar to 5.7 to 5.9 range Concern with obesity, HTN, HLD  Plan:  1. Not on any therapy currently  2. Encourage improved lifestyle - low carb, low sugar diet, reduce portion size, continue improving regular exercise 3. Follow-up 6-12 mo A1c       Morbid obesity with BMI of 40.0-44.9, adult (HCC)    Abnormal Weight gain +20 lbs in about 2 years approximately Encourage lifestyle improvement diet exercise      Mixed hyperlipidemia    Mostly controlled cholesterol improved lifestyle, elevated TG and slight increasing trend LDL Last lipid panel 09/2019 Calculated ASCVD 10 yr risk score >4% (based on HTN)  Plan: 1. Reviewed ASCVD risk - for now he is at appropriate risk level, no indication for ASA / Statin currently 2. Encourage improved lifestyle - low carb/cholesterol, reduce portion size, start regular exercise once foot pain improved      Relevant Medications   losartan (COZAAR) 50 MG tablet   Family history of prostate cancer    Screening with PSA 0.8, negative. Fam history father age 92+ Check yearly PSA      Essential hypertension    Inadequate HTN control. Above >140/90 back on Losartan - Home BP readings reviewed, R arm 150-170. No known complications     Plan:  1. Multiple repeat manual and electronic cuff readings BOTH arms. Overall average is around 140/90., larger cuff more accurate result. 2. Increase dose Losartan from 50mg  to 100mg  - take x 2 of the 50mg  tabs daily, has plenty of med at home. Trial 1-2 weeks, check home readings, write down, try LEFT arm and both arms actually now. 3. If still above goal >140/90, can return 4-6 weeks or sooner for BP follow up calibrate his own BP cuff here in office review readings, likely start or add new HTN agent, probably Amlodipine  Encourage improved lifestyle - low sodium diet,  regular exercise - Reduce caffeine      Relevant Medications   losartan (COZAAR) 50 MG tablet    Other Visit Diagnoses    Annual physical exam    -  Primary   Screening for colon cancer       Relevant Orders   Cologuard     Updated Health Maintenance information Reviewed recent lab results with patient Encouraged improvement to lifestyle with diet and exercise - Goal of weight loss   Due for routine colon cancer screening.  Last colonoscopy due to other issue 10 years ago, Meckel's diverticulum, no polyp, no fam history. -  Discussion today about recommendations for either Colonoscopy or Cologuard screening, benefits and risks of screening, interested in Cologuard, understands that if positive then recommendation is for diagnostic colonoscopy to follow-up. - Ordered Cologuard today   No orders of the defined types were placed in this encounter.   Follow up plan: Return in about 6 weeks (around 11/16/2019) for 6 weeks HTN if needed.  May return to calibrate his BP cuff or in person for BP check.  [name], [facility] 10/05/2019, 3:56 PM

## 2019-10-05 NOTE — Assessment & Plan Note (Signed)
Mostly controlled cholesterol improved lifestyle, elevated TG and slight increasing trend LDL Last lipid panel 09/2019 Calculated ASCVD 10 yr risk score >4% (based on HTN)  Plan: 1. Reviewed ASCVD risk - for now he is at appropriate risk level, no indication for ASA / Statin currently 2. Encourage improved lifestyle - low carb/cholesterol, reduce portion size, start regular exercise once foot pain improved

## 2019-10-05 NOTE — Assessment & Plan Note (Signed)
Abnormal Weight gain +20 lbs in about 2 years approximately Encourage lifestyle improvement diet exercise

## 2019-10-05 NOTE — Assessment & Plan Note (Addendum)
Inadequate HTN control. Above >140/90 back on Losartan - Home BP readings reviewed, R arm 150-170. No known complications     Plan:  1. Multiple repeat manual and electronic cuff readings BOTH arms. Overall average is around 140/90., larger cuff more accurate result. 2. Increase dose Losartan from 50mg  to 100mg  - take x 2 of the 50mg  tabs daily, has plenty of med at home. Trial 1-2 weeks, check home readings, write down, try LEFT arm and both arms actually now. 3. If still above goal >140/90, can return 4-6 weeks or sooner for BP follow up calibrate his own BP cuff here in office review readings, likely start or add new HTN agent, probably Amlodipine  Encourage improved lifestyle - low sodium diet, regular exercise - Reduce caffeine

## 2019-10-05 NOTE — Patient Instructions (Addendum)
Thank you for coming to the office today.  Increase Losartan 50 up to x 2 pills at once for 147m daily - keep track of BP BOTH arms and write down - most interested in left arm. If at goal < 140/90. Then keep up the good work, let me know when ready for new rx of Losartan 1040mpills.  If BP is not at goal, or questions or concerns about cuff, can return for another visit or nurse visit to check BP with your cuff here calibrate.  --------------------------------------   1. Chemistry - Normal results, including electrolytes, kidney and liver function. Slightly elevated fasting blood sugar   2. Hemoglobin A1c (Diabetes screening) - A1c 5.9 stable from prior 5.7 to 5.9 range, in range of Pre-Diabetes (>5.7 to 6.4)   3. PSA Prostate Cancer Screening - 0.8, negative.  4. TSH Thyroid Function Tests - Normal.  5. Cholesterol - Mostly normal cholesterol except slightly elevated triglycerides 189  6. CBC Blood Counts - Normal, no anemia, other abnormality  -----------------------------------------------  Colon Cancer Screening: - For all adults age 51+outine colon cancer screening is highly recommended.     - Recent guidelines from [place] age [place] [address] Early detection of colon cancer is important, because often there are no warning signs or symptoms, also if found early usually it can be cured. Late stage is hard to treat.  - If you are not interested in Colonoscopy screening (if done and normal you could be cleared for 5 to 10 years until next due), then Cologuard is an excellent alternative for screening test for Colon Cancer. It is highly sensitive for detecting DNA of colon cancer from even the earliest stages. Also, there is NO bowel prep required. - If Cologuard is NEGATIVE, then it is good for 3 years before next due - If Cologuard is POSITIVE, then it is strongly advised to get a Colonoscopy, which allows the GI doctor to locate the source of the  cancer or polyp (even very early stage) and treat it by removing it. ------------------------- If you would like to proceed with Cologuard (stool DNA test) - FIRST, call your insurance company and tell them you want to check cost of Cologuard tell them CPT Code [id number] may be completely covered and you could get for no cost, OR max cost without any coverage is about $600). Also, keep in mind if you do NOT open the kit, and decide not to do the test, you will NOT be charged, you should contact the company if you decide not to do the test. - If you want to proceed, you can notify usKoreaphone message, [place] at next visit) and we will order it for you. The test kit will be delivered to you house within about 1 week. Follow instructions to collect sample, you may call the company for any help or questions, 24/7 telephone support at [phone]  Please schedule a Follow-up Appointment to: Return in about 6 weeks (around 11/16/2019) for 6 weeks HTN if needed.  If you have any other questions or concerns, please feel free to call the office or send a message through [place] may also schedule an earlier appointment if necessary.  Additionally, you may be receiving a survey about your experience at our office within a few days to 1 week by e-mail or mail. We value your feedback.  AlNobie PutnamDO [facility]

## 2019-10-05 NOTE — Assessment & Plan Note (Signed)
Screening with PSA 0.8, negative. Fam history father age 51+ Check yearly PSA

## 2019-10-05 NOTE — Assessment & Plan Note (Signed)
Elevated A1c still at 5.9, similar to 5.7 to 5.9 range Concern with obesity, HTN, HLD  Plan:  1. Not on any therapy currently  2. Encourage improved lifestyle - low carb, low sugar diet, reduce portion size, continue improving regular exercise 3. Follow-up 6-12 mo A1c

## 2019-11-16 ENCOUNTER — Ambulatory Visit: Payer: BC Managed Care – PPO | Admitting: Family Medicine

## 2019-11-16 LAB — COLOGUARD: Cologuard: NEGATIVE

## 2019-11-23 LAB — COLOGUARD: COLOGUARD: NEGATIVE

## 2019-11-29 ENCOUNTER — Encounter: Payer: Self-pay | Admitting: Family Medicine

## 2019-12-28 ENCOUNTER — Encounter: Payer: Self-pay | Admitting: Family Medicine

## 2019-12-28 ENCOUNTER — Ambulatory Visit (INDEPENDENT_AMBULATORY_CARE_PROVIDER_SITE_OTHER): Payer: BC Managed Care – PPO | Admitting: Family Medicine

## 2019-12-28 ENCOUNTER — Other Ambulatory Visit: Payer: Self-pay

## 2019-12-28 ENCOUNTER — Other Ambulatory Visit: Payer: Self-pay | Admitting: Family Medicine

## 2019-12-28 VITALS — BP 138/88 | HR 90 | Temp 97.5°F | Resp 16 | Ht 69.0 in | Wt 270.0 lb

## 2019-12-28 DIAGNOSIS — I10 Essential (primary) hypertension: Secondary | ICD-10-CM

## 2019-12-28 DIAGNOSIS — M722 Plantar fascial fibromatosis: Secondary | ICD-10-CM

## 2019-12-28 MED ORDER — LOSARTAN POTASSIUM 100 MG PO TABS: 100.0000 mg | ORAL_TABLET | Freq: Every day | ORAL | 1 refills | Status: DC

## 2019-12-28 MED ORDER — MELOXICAM 15 MG PO TABS: 15.0000 mg | ORAL_TABLET | Freq: Every day | ORAL | 3 refills | Status: DC | PRN

## 2019-12-28 NOTE — Patient Instructions (Addendum)
Thank you for coming to the office today.  Keep up the good work!  NEW Medicine ordered - Losartan 100mg  - take one daily. Can stop or finish the old 50mg  pills (may take 2 of those if you prefer)  Double check your bottle and the mg dose.  I am pleased with improved BP readings.  Goal is still < 135 / 85, if we can get to < 140 / 90 that will be an excellent next step.  In future if you see higher BP readings or new symptoms or concerns, we can add the next medicine Amlodipine if you are interested call or schedule and we can talk sooner.   Cologuard was negative! Good for 3 years, next is 11/2022   Please schedule a Follow-up Appointment to: Return in about 6 months (around 06/29/2020) for 6 month HTN, PreDM A1c.  If you have any other questions or concerns, please feel free to call the office or send a message through [place]. You may also schedule an earlier appointment if necessary.  Additionally, you may be receiving a survey about your experience at our office within a few days to 1 week by e-mail or mail. We value your feedback.  Michael Putnam, DO [place]

## 2019-12-28 NOTE — Progress Notes (Signed)
Subjective:    Patient ID: Michael Taylor, male    DOB: 12-06-1970, 49 y.o.   MRN: [id number]  Michael Taylor is a 49 y.o. male presenting on 12/28/2019 for Hypertension   HPI   CHRONIC HTN / Morbid Obesity - Last visit with me 10/05/19, for same problem uncontrolled HTN, treated with double dose Losartan from 50 to 100 (take x 2 of 50mg ), see prior notes for background information. - Interval update with he has monitored BP regularly at home, however on med rec today he reports not actually taking the double dose of Losartan, says this was overlooked and he has been actually still taking one of the 50mg  tabs daily, he has improved his diet and lost several lbs - Today patient reports doing fairly well, Now home BP improved 140-150 / 80-90 on avg, previously was 150-170 / 90 - 110 Current Meds -Losartan 50mg daily (did not increase yet, unintentionally) Reports good compliance, took meds today. Tolerating well, w/o complaints. 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  - LAST UPDATED - Cologuard 11/16/19 NEGATIVE. Next due 11/2022   Depression screen [facility] 2/9 12/28/2019 09/07/2019 12/29/2017  Decreased Interest 0 0 0  Down, Depressed, Hopeless 0 0 0  PHQ - 2 Score 0 0 0    Social History   Tobacco Use   Smoking status: Never Smoker   Smokeless tobacco: Never Used  Vaping Use   Vaping Use: Never used  Substance Use Topics   Alcohol use: Yes    Alcohol/week: 4.0 standard drinks    Types: 4 Shots of liquor per week    Comment: rare, < 1 x monthly   Drug use: No   Family History  Problem Relation Age of Onset   Arthritis Mother    Hypertension Mother    Arthritis Father         knee replacement   Hypertension Father    Heart disease Father 36   Heart attack Father 11   Prostate cancer Father 54       radiation treatment successful   Colon cancer Neg Hx      Review of Systems Per HPI unless specifically indicated above     Objective:    BP 138/88 (BP Location: Left Arm, Cuff Size: Normal)    Pulse 90    Temp (!) 97.5 F (36.4 C) (Temporal)    Resp 16    Ht 5\' 9"  (1.753 m)    Wt 270 lb (122.5 kg)    SpO2 98%    BMI 39.87 kg/m   Wt Readings from Last 3 Encounters:  12/28/19 270 lb (122.5 kg)  10/05/19 272 lb 9.6 oz (123.7 kg)  09/07/19 273 lb 9.6 oz (124.1 kg)    Physical Exam Vitals and nursing note reviewed.  Constitutional:      General: He is not in acute distress.    Appearance: He is well-developed. He is obese. He is not diaphoretic.     Comments: Well-appearing, comfortable, cooperative  HENT:     Head: Normocephalic and atraumatic.  Eyes:     General:        Right eye: No discharge.        Left eye: No  discharge.     Conjunctiva/sclera: Conjunctivae normal.  Neck:     Thyroid: No thyromegaly.  Cardiovascular:     Rate and Rhythm: Normal rate and regular rhythm.     Heart sounds: Normal heart sounds. No murmur heard.   Pulmonary:     Effort: Pulmonary effort is normal. No respiratory distress.     Breath sounds: Normal breath sounds. No wheezing or rales.  Musculoskeletal:        General: Normal range of motion.     Cervical back: Normal range of motion and neck supple.  Lymphadenopathy:     Cervical: No cervical adenopathy.  Skin:    General: Skin is warm and dry.     Findings: No erythema or rash.  Neurological:     Mental Status: He is alert and oriented to person, place, and time.  Psychiatric:        Behavior: Behavior normal.     Comments: Well groomed, good eye contact, normal speech and thoughts    Results for orders placed or performed in visit on 11/29/19  Cologuard  Result Value Ref Range   Cologuard  Negative Negative      Assessment & Plan:   Problem List Items Addressed This Visit    Morbid obesity ([place])    Improved wt loss with lifestyle diet      Essential hypertension - Primary    Improved w/ lifestyle and wt loss, but still inadequate HTN control. Above >140/90, unintentionally did not follow instruction on dosing, still at same dose Losartan 50mg  - Home BP readings reviewed No known complications   Goal is < 135/85    Plan:  Trial again to increase dose Losartan from 50mg  to 100mg  - will order Losartan 100mg  daily now, new rx sent, he still may choose to finish the 50mg  tabs If still above  >140/90, can return 4-6 weeks or sooner for BP follow up calibrate his own BP cuff here in office review readings, likely start or add new HTN agent, probably Amlodipine (we discussed adding this and he is interested but at this point may not need it due to now room to double dose Losartan) Encourage improved lifestyle - low sodium diet, regular exercise - Reduce caffeine  Reconsider addition of Amlodipine at next visit within 6 months      Relevant Medications   losartan (COZAAR) 100 MG tablet        Meds ordered this encounter  Medications   losartan (COZAAR) 100 MG tablet    Sig: Take 1 tablet (100 mg total) by mouth daily.    Dispense:  90 tablet    Refill:  1      Follow up plan: Return in about 6 months (around 06/29/2020) for 6 month HTN, PreDM A1c.   [name], [facility] 12/28/2019, 3:56 PM

## 2019-12-29 NOTE — Assessment & Plan Note (Addendum)
Improved w/ lifestyle and wt loss, but still inadequate HTN control. Above >140/90, unintentionally did not follow instruction on dosing, still at same dose Losartan 50mg  - Home BP readings reviewed No known complications   Goal is < 135/85    Plan:  Trial again to increase dose Losartan from 50mg  to 100mg  - will order Losartan 100mg  daily now, new rx sent, he still may choose to finish the 50mg  tabs If still above  >140/90, can return 4-6 weeks or sooner for BP follow up calibrate his own BP cuff here in office review readings, likely start or add new HTN agent, probably Amlodipine (we discussed adding this and he is interested but at this point may not need it due to now room to double dose Losartan) Encourage improved lifestyle - low sodium diet, regular exercise - Reduce caffeine  Reconsider addition of Amlodipine at next visit within 6 months

## 2019-12-29 NOTE — Assessment & Plan Note (Signed)
Improved wt loss with lifestyle diet

## 2020-01-02 ENCOUNTER — Other Ambulatory Visit: Payer: Self-pay

## 2020-01-02 ENCOUNTER — Encounter: Payer: Self-pay | Admitting: Dermatology

## 2020-01-02 ENCOUNTER — Ambulatory Visit (INDEPENDENT_AMBULATORY_CARE_PROVIDER_SITE_OTHER): Payer: BC Managed Care – PPO | Admitting: Dermatology

## 2020-01-02 DIAGNOSIS — L821 Other seborrheic keratosis: Secondary | ICD-10-CM

## 2020-01-02 DIAGNOSIS — Z1283 Encounter for screening for malignant neoplasm of skin: Secondary | ICD-10-CM

## 2020-01-02 DIAGNOSIS — D1800 Hemangioma unspecified site: Secondary | ICD-10-CM

## 2020-01-02 DIAGNOSIS — D229 Melanocytic nevi, unspecified: Secondary | ICD-10-CM

## 2020-01-02 DIAGNOSIS — L814 Other melanin hyperpigmentation: Secondary | ICD-10-CM

## 2020-01-02 DIAGNOSIS — L578 Other skin changes due to chronic exposure to nonionizing radiation: Secondary | ICD-10-CM

## 2020-01-02 DIAGNOSIS — Z86018 Personal history of other benign neoplasm: Secondary | ICD-10-CM

## 2020-01-02 NOTE — Progress Notes (Signed)
Follow-Up Visit   Subjective  Michael Taylor is a 49 y.o. male who presents for the following: TBSE. He has history of dysplastic nevi removed in the past.  He more recently is postop from lipoma excision from his side.  He is doing well. The patient presents for Total-Body Skin Exam (TBSE) for skin cancer screening and mole check. Patient presents today for annual TBSE, has no concerns today. Patient does however have h/o dysplastic nevi as listed in history  The following portions of the chart were reviewed this encounter and updated as appropriate:  Tobacco  Allergies  Meds  Problems  Med Hx  Surg Hx  Fam Hx     Review of Systems:  No other skin or systemic complaints except as noted in HPI or Assessment and Plan.  Objective  Well appearing patient in no apparent distress; mood and affect are within normal limits.  A full examination was performed including scalp, head, eyes, ears, nose, lips, neck, chest, axillae, abdomen, back, buttocks, bilateral upper extremities, bilateral lower extremities, hands, feet, fingers, toes, fingernails, and toenails. All findings within normal limits unless otherwise noted below.  Objective  Severe: R. Post shoulder, L. Low back paraspinal, R. and low back paraspinal above sacral. Mild:  Low back, L. post side, and L. lat waistline: Scar with no evidence of recurrence.   Assessment & Plan  History of dysplastic nevus Severe: R. Post shoulder, L. Low back paraspinal, R. and low back paraspinal above sacral. Mild:  Low back, L. post side, and L. lat waistline  Clear. Observe for recurrence. Call clinic for new or changing lesions.  Recommend regular skin exams, daily broad-spectrum spf 30+ sunscreen use, and photoprotection.      Lentigines - Scattered tan macules - Discussed due to sun exposure - Benign, observe - Call for any changes  Seborrheic Keratoses - Stuck-on, waxy, tan-brown papules and plaques  - Discussed benign etiology  and prognosis. - Observe - Call for any changes  Melanocytic Nevi - Tan-brown and/or pink-flesh-colored symmetric macules and papules - Benign appearing on exam today - Observation - Call clinic for new or changing moles - Recommend daily use of broad spectrum spf 30+ sunscreen to sun-exposed areas.   Hemangiomas - Red papules - Discussed benign nature - Observe - Call for any changes  Actinic Damage - diffuse scaly erythematous macules with underlying dyspigmentation - Recommend daily broad spectrum sunscreen SPF 30+ to sun-exposed areas, reapply every 2 hours as needed.  - Call for new or changing lesions.  History of Moderate Dysplastic Nevi R.Post shoulder, L. Low back paraspinal, R low back paraspinal above sacral 04/27/19,  L. Sup med. Scapula 04/26/15. - No evidence of recurrence today - Recommend regular full body skin exams - Recommend daily broad spectrum sunscreen SPF 30+ to sun-exposed areas, reapply every 2 hours as needed.  - Call if any new or changing lesions are noted between office visits   History of  Mild Dysplastic Nevi Low back spinal, L post side, L lat waistline 04/26/15 - No evidence of recurrence today - Recommend regular full body skin exams - Recommend daily broad spectrum sunscreen SPF 30+ to sun-exposed areas, reapply every 2 hours as needed.  - Call if any new or changing lesions are noted between office visits  Skin cancer screening performed today.  Return in about 1 year (around 01/01/2021) for TBSE.  I, Donzetta Kohut, CMA, am acting as scribe for Sarina Ser, MD . Documentation: I have reviewed  the above documentation for accuracy and completeness, and I agree with the above.  Sarina Ser, MD

## 2020-01-02 NOTE — Patient Instructions (Signed)
Recommend daily broad spectrum sunscreen SPF 30+ to sun-exposed areas, reapply every 2 hours as needed. Call for new or changing lesions.  

## 2020-01-09 ENCOUNTER — Encounter: Payer: Self-pay | Admitting: Dermatology

## 2020-06-26 ENCOUNTER — Other Ambulatory Visit: Payer: Self-pay | Admitting: Family Medicine

## 2020-06-26 DIAGNOSIS — I10 Essential (primary) hypertension: Secondary | ICD-10-CM

## 2020-07-04 ENCOUNTER — Ambulatory Visit: Payer: BC Managed Care – PPO | Admitting: Family Medicine

## 2020-07-11 ENCOUNTER — Encounter: Payer: Self-pay | Admitting: Family Medicine

## 2020-07-11 ENCOUNTER — Ambulatory Visit (INDEPENDENT_AMBULATORY_CARE_PROVIDER_SITE_OTHER): Payer: BC Managed Care – PPO | Admitting: Family Medicine

## 2020-07-11 ENCOUNTER — Other Ambulatory Visit: Payer: Self-pay | Admitting: Family Medicine

## 2020-07-11 ENCOUNTER — Other Ambulatory Visit: Payer: Self-pay

## 2020-07-11 VITALS — BP 145/95 | HR 91 | Ht 69.0 in | Wt 270.2 lb

## 2020-07-11 DIAGNOSIS — R7303 Prediabetes: Secondary | ICD-10-CM

## 2020-07-11 DIAGNOSIS — Z125 Encounter for screening for malignant neoplasm of prostate: Secondary | ICD-10-CM

## 2020-07-11 DIAGNOSIS — Z1159 Encounter for screening for other viral diseases: Secondary | ICD-10-CM

## 2020-07-11 DIAGNOSIS — Z8042 Family history of malignant neoplasm of prostate: Secondary | ICD-10-CM

## 2020-07-11 DIAGNOSIS — I10 Essential (primary) hypertension: Secondary | ICD-10-CM

## 2020-07-11 DIAGNOSIS — Z0000 Encounter for general adult medical examination without abnormal findings: Secondary | ICD-10-CM

## 2020-07-11 DIAGNOSIS — E782 Mixed hyperlipidemia: Secondary | ICD-10-CM

## 2020-07-11 LAB — POCT GLYCOSYLATED HEMOGLOBIN (HGB A1C): Hemoglobin A1C: 5.7 % — AB (ref 4.0–5.6)

## 2020-07-11 MED ORDER — LOSARTAN POTASSIUM 100 MG PO TABS: 100.0000 mg | ORAL_TABLET | Freq: Every day | ORAL | 1 refills | Status: DC

## 2020-07-11 NOTE — Assessment & Plan Note (Signed)
Improved A1c down from 5.9 to 5.7 Concern with obesity, HTN, HLD  Plan:  1. Not on any therapy currently  2. Encourage improved lifestyle - low carb, low sugar diet, reduce portion size, continue improving regular exercise

## 2020-07-11 NOTE — Assessment & Plan Note (Signed)
Wt stable Encourage diet lifestyle exercise wt loss

## 2020-07-11 NOTE — Progress Notes (Signed)
Subjective:    Patient ID: Michael Taylor, male    DOB: 1971-04-25, 50 y.o.   MRN: [id number]  Michael Taylor is a 50 y.o. male presenting on 07/11/2020 for Prediabetes and Hypertension   HPI   CHRONIC HTN / Morbid Obesity Last few visit still had elevated BP, Losartan inc from 50 to 100mg  See prior notes for background information. No recent interval home BP readings. Needs to restart Current Meds -Losartan 100mg daily Reports good compliance, took meds today. Tolerating well, w/o complaints. Denies CP, dyspnea, HA, edema, dizziness / lightheadedness  Morbid Obesity BMI >39 Improving sodas and starches, breads limiting these now, still admits some work to do Not drinking alcohol Fam history HLD Triglycerides  [facility] Shoulder issue PT regularly Improving. Rarely Meloxicam   Depression screen [facility] 2/9 12/28/2019 09/07/2019 12/29/2017  Decreased Interest 0 0 0  Down, Depressed, Hopeless 0 0 0  PHQ - 2 Score 0 0 0    Social History   Tobacco Use  . Smoking status: Never Smoker  . Smokeless tobacco: Never Used  Vaping Use  . Vaping Use: Never used  Substance Use Topics  . Alcohol use: Yes    Alcohol/week: 4.0 standard drinks    Types: 4 Shots of liquor per week    Comment: rare, < 1 x monthly  . Drug use: No    Review of Systems Per HPI unless specifically indicated above     Objective:    BP (!) 145/95 (BP Location: Left Arm, Cuff Size: Normal)   Pulse 91   Ht 5\' 9"  (1.753 m)   Wt 270 lb 3.2 oz (122.6 kg)   SpO2 99%   BMI 39.90 kg/m   Wt Readings from Last 3 Encounters:  07/11/20 270 lb 3.2 oz (122.6 kg)  12/28/19 270 lb (122.5 kg)  10/05/19 272 lb 9.6 oz (123.7 kg)    Physical Exam Vitals and nursing note reviewed.  Constitutional:      General: He is not in acute distress.    Appearance: He is well-developed and well-nourished. He is obese. He is not diaphoretic.     Comments: Well-appearing, comfortable, cooperative  HENT:      Head: Normocephalic and atraumatic.     Mouth/Throat:     Mouth: Oropharynx is clear and moist.  Eyes:     General:        Right eye: No discharge.        Left eye: No discharge.     Conjunctiva/sclera: Conjunctivae normal.  Neck:     Thyroid: No thyromegaly.  Cardiovascular:     Rate and Rhythm: Normal rate and regular rhythm.     Pulses: Intact distal pulses.     Heart sounds: Normal heart sounds. No murmur heard.   Pulmonary:     Effort: Pulmonary effort is normal. No respiratory distress.     Breath sounds: Normal breath sounds. No wheezing or rales.  Musculoskeletal:        General: No edema. Normal range of motion.     Cervical back: Normal range of motion and neck supple.  Lymphadenopathy:     Cervical: No cervical adenopathy.  Skin:    General: Skin is warm and dry.     Findings: No erythema or rash.  Neurological:     Mental Status: He is alert and oriented to person, place, and time.  Psychiatric:        Mood and Affect: Mood and affect normal.  Behavior: Behavior normal.     Comments: Well groomed, good eye contact, normal speech and thoughts       Results for orders placed or performed in visit on 07/11/20  POCT HgB A1C  Result Value Ref Range   Hemoglobin A1C 5.7 (A) 4.0 - 5.6 %   Recent Labs    10/03/19 0804 07/11/20 1446  HGBA1C 5.9* 5.7*      Assessment & Plan:   Problem List Items Addressed This Visit    Pre-diabetes - Primary    Improved A1c down from 5.9 to 5.7 Concern with obesity, HTN, HLD  Plan:  1. Not on any therapy currently  2. Encourage improved lifestyle - low carb, low sugar diet, reduce portion size, continue improving regular exercise      Relevant Orders   POCT HgB A1C (Completed)   Morbid obesity ([facility])    Wt stable Encourage diet lifestyle exercise wt loss      Essential hypertension    Still elevated BP with HTN inadequate control Need home BP log again No known complications   Plan:  Continue Losartan  100mg  daily If still above  >140/90, can return sooner for BP follow up calibrate his own BP cuff here in office review readings May need 2nd agent in future Amlodipine Encourage improved lifestyle - low sodium diet, regular exercise - Reduce caffeine      Relevant Medications   losartan (COZAAR) 100 MG tablet      Meds ordered this encounter  Medications  . losartan (COZAAR) 100 MG tablet    Sig: Take 1 tablet (100 mg total) by mouth daily.    Dispense:  90 tablet    Refill:  1      Follow up plan: Return in about 3 months (around 10/11/2020) for 3 month fasting lab only then 1 week later Annual Physical.  Future labs ordered for 10/10/20   [name], [facility] 07/11/2020, 2:44 PM

## 2020-07-11 NOTE — Assessment & Plan Note (Signed)
Still elevated BP with HTN inadequate control Need home BP log again No known complications   Plan:  Continue Losartan 100mg  daily If still above  >140/90, can return sooner for BP follow up calibrate his own BP cuff here in office review readings May need 2nd agent in future Amlodipine Encourage improved lifestyle - low sodium diet, regular exercise - Reduce caffeine

## 2020-07-11 NOTE — Patient Instructions (Addendum)
Thank you for coming to the office today.  Keep track on blood pressure.  Recent Labs    10/03/19 0804 07/11/20 1446  HGBA1C 5.9* 5.7*    DUE for FASTING BLOOD WORK (no food or drink after midnight before the lab appointment, only water or coffee without cream/sugar on the morning of)  SCHEDULE "Lab Only" visit in the morning at the clinic for lab draw in 3 MONTHS   - Make sure Lab Only appointment is at about 1 week before your next appointment, so that results will be available  For Lab Results, once available within 2-3 days of blood draw, you can can log in to MyChart online to view your results and a brief explanation. Also, we can discuss results at next follow-up visit.    Please schedule a Follow-up Appointment to: Return in about 3 months (around 10/11/2020) for 3 month fasting lab only then 1 week later Annual Physical.  If you have any other questions or concerns, please feel free to call the office or send a message through [place]. You may also schedule an earlier appointment if necessary.  Additionally, you may be receiving a survey about your experience at our office within a few days to 1 week by e-mail or mail. We value your feedback.  [name], DO [place]

## 2020-10-10 ENCOUNTER — Other Ambulatory Visit: Payer: BC Managed Care – PPO

## 2020-10-10 ENCOUNTER — Other Ambulatory Visit: Payer: Self-pay

## 2020-10-10 DIAGNOSIS — R7303 Prediabetes: Secondary | ICD-10-CM

## 2020-10-10 DIAGNOSIS — Z0000 Encounter for general adult medical examination without abnormal findings: Secondary | ICD-10-CM

## 2020-10-10 DIAGNOSIS — Z8042 Family history of malignant neoplasm of prostate: Secondary | ICD-10-CM

## 2020-10-10 DIAGNOSIS — E782 Mixed hyperlipidemia: Secondary | ICD-10-CM

## 2020-10-10 DIAGNOSIS — Z125 Encounter for screening for malignant neoplasm of prostate: Secondary | ICD-10-CM

## 2020-10-10 DIAGNOSIS — I10 Essential (primary) hypertension: Secondary | ICD-10-CM

## 2020-10-10 DIAGNOSIS — Z1159 Encounter for screening for other viral diseases: Secondary | ICD-10-CM

## 2020-10-11 LAB — CBC WITH DIFFERENTIAL/PLATELET
Absolute Monocytes: 371 cells/uL (ref 200–950)
Basophils Absolute: 41 cells/uL (ref 0–200)
Basophils Relative: 0.7 %
Eosinophils Absolute: 168 cells/uL (ref 15–500)
Eosinophils Relative: 2.9 %
HCT: 48.3 % (ref 38.5–50.0)
Hemoglobin: 15.4 g/dL (ref 13.2–17.1)
Lymphs Abs: 1438 cells/uL (ref 850–3900)
MCH: 27.1 pg (ref 27.0–33.0)
MCHC: 31.9 g/dL — ABNORMAL LOW (ref 32.0–36.0)
MCV: 84.9 fL (ref 80.0–100.0)
MPV: 10.4 fL (ref 7.5–12.5)
Monocytes Relative: 6.4 %
Neutro Abs: 3782 cells/uL (ref 1500–7800)
Neutrophils Relative %: 65.2 %
Platelets: 237 10*3/uL (ref 140–400)
RBC: 5.69 10*6/uL (ref 4.20–5.80)
RDW: 13.9 % (ref 11.0–15.0)
Total Lymphocyte: 24.8 %
WBC: 5.8 10*3/uL (ref 3.8–10.8)

## 2020-10-11 LAB — COMPLETE METABOLIC PANEL WITH GFR
AG Ratio: 1.8 (calc) (ref 1.0–2.5)
ALT: 33 U/L (ref 9–46)
AST: 28 U/L (ref 10–35)
Albumin: 4.4 g/dL (ref 3.6–5.1)
Alkaline phosphatase (APISO): 63 U/L (ref 35–144)
BUN: 13 mg/dL (ref 7–25)
CO2: 28 mmol/L (ref 20–32)
Calcium: 8.9 mg/dL (ref 8.6–10.3)
Chloride: 104 mmol/L (ref 98–110)
Creat: 0.93 mg/dL (ref 0.70–1.33)
GFR, Est African American: 109 mL/min/{1.73_m2} (ref >=60)
GFR, Est Non African American: 94 mL/min/{1.73_m2} (ref >=60)
Globulin: 2.5 g/dL (calc) (ref 1.9–3.7)
Glucose, Bld: 122 mg/dL — ABNORMAL HIGH (ref 65–99)
Potassium: 4.2 mmol/L (ref 3.5–5.3)
Sodium: 140 mmol/L (ref 135–146)
Total Bilirubin: 0.7 mg/dL (ref 0.2–1.2)
Total Protein: 6.9 g/dL (ref 6.1–8.1)

## 2020-10-11 LAB — HEPATITIS C ANTIBODY
Hepatitis C Ab: NONREACTIVE
SIGNAL TO CUT-OFF: 0.01 (ref <1.00)

## 2020-10-11 LAB — HEMOGLOBIN A1C
Hgb A1c MFr Bld: 6.2 % of total Hgb — ABNORMAL HIGH (ref <5.7)
Mean Plasma Glucose: 131 mg/dL
eAG (mmol/L): 7.3 mmol/L

## 2020-10-11 LAB — LIPID PANEL
Cholesterol: 161 mg/dL (ref <200)
HDL: 48 mg/dL (ref >=40)
LDL Cholesterol (Calc): 85 mg/dL (calc)
Non-HDL Cholesterol (Calc): 113 mg/dL (calc) (ref <130)
Total CHOL/HDL Ratio: 3.4 (calc) (ref <5.0)
Triglycerides: 181 mg/dL — ABNORMAL HIGH (ref <150)

## 2020-10-11 LAB — PSA: PSA: 0.65 ng/mL (ref <=4.00)

## 2020-10-16 ENCOUNTER — Other Ambulatory Visit: Payer: Self-pay

## 2020-10-16 ENCOUNTER — Ambulatory Visit (INDEPENDENT_AMBULATORY_CARE_PROVIDER_SITE_OTHER): Payer: BC Managed Care – PPO | Admitting: Family Medicine

## 2020-10-16 ENCOUNTER — Encounter: Payer: Self-pay | Admitting: Family Medicine

## 2020-10-16 VITALS — BP 152/81 | HR 90 | Ht 69.0 in | Wt 274.6 lb

## 2020-10-16 DIAGNOSIS — M65332 Trigger finger, left middle finger: Secondary | ICD-10-CM

## 2020-10-16 DIAGNOSIS — E782 Mixed hyperlipidemia: Secondary | ICD-10-CM

## 2020-10-16 DIAGNOSIS — I10 Essential (primary) hypertension: Secondary | ICD-10-CM

## 2020-10-16 DIAGNOSIS — R7303 Prediabetes: Secondary | ICD-10-CM | POA: Diagnosis not present

## 2020-10-16 DIAGNOSIS — Z0000 Encounter for general adult medical examination without abnormal findings: Secondary | ICD-10-CM | POA: Diagnosis not present

## 2020-10-16 DIAGNOSIS — B078 Other viral warts: Secondary | ICD-10-CM

## 2020-10-16 DIAGNOSIS — Z8042 Family history of malignant neoplasm of prostate: Secondary | ICD-10-CM

## 2020-10-16 MED ORDER — LOSARTAN POTASSIUM 100 MG PO TABS: 100.0000 mg | ORAL_TABLET | Freq: Every day | ORAL | 1 refills | Status: DC

## 2020-10-16 NOTE — Assessment & Plan Note (Signed)
Mild gradual wt increase Encourage diet lifestyle exercise wt loss

## 2020-10-16 NOTE — Patient Instructions (Addendum)
Thank you for coming to the office today.  1. Chemistry - Normal results, including electrolytes, kidney and liver function.  Mild elevated fasting blood sugar   2. Hemoglobin A1c (Diabetes screening) - 6.2, elevated in range of Pre-Diabetes (>5.7 to 6.4)   3. Routine screening Hepatitis C - Negative.  4. PSA Prostate Cancer Screening - 0.65, negative.  5. Cholesterol - Mostly normal cholesterol except slightly higher TG 181, normal LDL 85.  6. CBC Blood Counts - Normal, no anemia, other abnormality  ----------------  Please schedule and return for a NURSE ONLY VISIT for VACCINE - when ready  - Need Shingrix Shingles vaccine 2 doses - 2-6 months apart, can cause flu like reaction  Left middle trigger finger, may need orthopedic consult in future, can release trigger finger or other therapy.  Wart can use topical wart pad for removal of top layers of skin. May need Dermatology injection, cryo or cautery.  Please schedule a Follow-up Appointment to: Return in about 4 months (around 02/15/2021) for 3-4 months follow-up PreDM A1c, HTN.  If you have any other questions or concerns, please feel free to call the office or send a message through [place]. You may also schedule an earlier appointment if necessary.  Additionally, you may be receiving a survey about your experience at our office within a few days to 1 week by e-mail or mail. We value your feedback.  [name], DO [place]

## 2020-10-16 NOTE — Assessment & Plan Note (Signed)
Mostly controlled cholesterol improved lifestyle, elevated TG still Last lipid panel 10/2020 The 10-year ASCVD risk score Mikey Bussing DC Jr., et al., 2013) is: 5.2%  Plan: 1. Reviewed ASCVD risk - for now he is at appropriate risk level, no indication for ASA / Statin currently 2. Encourage improved lifestyle - low carb/cholesterol, reduce portion size, start regular exercise once foot pain improved

## 2020-10-16 NOTE — Progress Notes (Addendum)
Subjective:    Patient ID: Michael Taylor, male    DOB: 16-May-1970, 50 y.o.   MRN: [id number]  Michael Taylor is a 50 y.o. male presenting on 10/16/2020 for Annual Exam and Hypertension   HPI   Here for Annual Physical and Lab Review.  CHRONIC HTN/ Morbid Obesity BMI >40 Previous visit still had elevated BP, Losartan inc from 50 to 100mg , he has done better but lately not adhering to medication  See prior notes for background information. No recent interval home BP readings. Needs to restart Current Meds -Losartan 100mg daily (not on currently needs re order) That he did not take med today Tolerating well, w/o complaints. Denies CP, dyspnea, HA, edema, dizziness / lightheadedness  Morbid Obesity BMI >40 Improving sodas and starches, breadslimiting these now, still admits some work to do Fam history HLD Triglycerides  [facility] Shoulder issue PT regularly Improving. Rarely Meloxicam  Health Maintenance: Cologuard completed 11/16/19. Negative  Depression screen [facility] 2/9 10/16/2020 12/28/2019 09/07/2019  Decreased Interest 0 0 0  Down, Depressed, Hopeless 0 0 0  PHQ - 2 Score 0 0 0  Altered sleeping 0 - -  Tired, decreased energy 0 - -  Change in appetite 0 - -  Feeling bad or failure about yourself  0 - -  Trouble concentrating 0 - -  Moving slowly or fidgety/restless 0 - -  Suicidal thoughts 0 - -  PHQ-9 Score 0 - -  Difficult doing work/chores Not difficult at all - -    Past Medical History:  Diagnosis Date  . Dysplastic nevus 04/26/2015   Left sup. med. scapula. Moderate atypia, margins free.  Marland Kitchen Dysplastic nevus 04/26/2015   Low back spinal. Mild atypia, deep margin involved.  Marland Kitchen Dysplastic nevus 04/26/2015   Left posterior side. Mild atypia, lateral margin involved.  Marland Kitchen Dysplastic nevus 04/26/2015   Left lateral waistline. Mild atypia, lateral and deep margin involved.   Marland Kitchen Dysplastic nevus 04/27/2019   Right post. shoulder. Moderate atypia,  close to margin  . Dysplastic nevus 04/27/2019   Left low back paraspinal. Moderate atypia, peripheral margin involved.  Marland Kitchen Dysplastic nevus 04/27/2019   Right low back paraspinal above sacral. Moderate atypia, margins free.   . Plantar fasciitis, bilateral    right greater that left,  diagnosed by Dr.  Claris Che   Past Surgical History:  Procedure Laterality Date  . HIP SURGERY     right,  [facility] bone graft  . MECKEL DIVERTICULUM EXCISION  2011   [name]  . SHOULDER DEBRIDEMENT     [facility]   Social History   Socioeconomic History  . Marital status: Married    Spouse name: Not on file  . Number of children: 3  . 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 Highest education level: Not on file  Occupational History  . Occupation: Forensic psychologist (Engineer, building services)    Comment: Practices locally in [place]  Tobacco Use  . Smoking status: Never Smoker  . Smokeless tobacco: Never Used  Vaping Use  . Vaping Use: Never used  Substance and Sexual Activity  . Alcohol use: Yes    Alcohol/week: 4.0 standard drinks    Types: 4 Shots of liquor per week    Comment: rare, < 1 x monthly  . Drug use: No  . Sexual activity: Not on file  Other Topics Concern  . Not on file  Social History Narrative  . Not on file   Social Determinants of Health  Financial Resource Strain: Not on file  Food Insecurity: Not on file  Transportation Needs: Not on file  Physical Activity: Not on file  Stress: Not on file  Social Connections: Not on file  Intimate Partner Violence: Not on file   Family History  Problem Relation Age of Onset  . Arthritis Mother   . Hypertension Mother   . Arthritis Father        knee replacement  . Hypertension Father   . Heart disease Father 17  . Heart attack Father 93  . Prostate cancer Father 20       radiation treatment successful  . Colon cancer Neg Hx    Current Outpatient Medications on File Prior to Visit  Medication Sig  . diclofenac Sodium (VOLTAREN) 1 % GEL  Apply 2 g topically 3 (three) times daily as needed.  . loratadine (CLARITIN) 10 MG tablet Take 10 mg by mouth daily.  . meloxicam (MOBIC) 15 MG tablet Take 1 tablet (15 mg total) by mouth daily as needed for pain.  Marland Kitchen omeprazole (PRILOSEC) 20 MG capsule Take 1 capsule (20 mg total) by mouth daily.   No current facility-administered medications on file prior to visit.    Review of Systems  Constitutional: Negative for activity change, appetite change, chills, diaphoresis, fatigue and fever.  HENT: Negative for congestion and hearing loss.   Eyes: Negative for visual disturbance.  Respiratory: Negative for cough, chest tightness, shortness of breath and wheezing.   Cardiovascular: Negative for chest pain, palpitations and leg swelling.  Gastrointestinal: Negative for abdominal pain, constipation, diarrhea, nausea and vomiting.  Endocrine: Negative for cold intolerance.  Genitourinary: Negative for dysuria, frequency and hematuria.  Musculoskeletal: Negative for arthralgias and neck pain.  Skin: Negative for rash.  Allergic/Immunologic: Negative for environmental allergies.  Neurological: Negative for dizziness, weakness, light-headedness, numbness and headaches.  Hematological: Negative for adenopathy.  Psychiatric/Behavioral: Negative for behavioral problems, dysphoric mood and sleep disturbance.   Per HPI unless specifically indicated above      Objective:    BP (!) 152/81   Pulse 90   Ht 5\' 9"  (1.753 m)   Wt 274 lb 9.6 oz (124.6 kg)   SpO2 97%   BMI 40.55 kg/m   Wt Readings from Last 3 Encounters:  10/16/20 274 lb 9.6 oz (124.6 kg)  07/11/20 270 lb 3.2 oz (122.6 kg)  12/28/19 270 lb (122.5 kg)    Physical Exam Vitals and nursing note reviewed.  Constitutional:      General: He is not in acute distress.    Appearance: He is well-developed. He is not diaphoretic.     Comments: Well-appearing, comfortable, cooperative  HENT:     Head: Normocephalic and atraumatic.   Eyes:     General:        Right eye: No discharge.        Left eye: No discharge.     Conjunctiva/sclera: Conjunctivae normal.     Pupils: Pupils are equal, round, and reactive to light.  Neck:     Thyroid: No thyromegaly.  Cardiovascular:     Rate and Rhythm: Normal rate and regular rhythm.     Heart sounds: Normal heart sounds. No murmur heard.   Pulmonary:     Effort: Pulmonary effort is normal. No respiratory distress.     Breath sounds: Normal breath sounds. No wheezing or rales.  Abdominal:     General: Bowel sounds are normal. There is no distension.     Palpations: Abdomen is  soft. There is no mass.     Tenderness: There is no abdominal tenderness.  Musculoskeletal:        General: No tenderness. Normal range of motion.     Cervical back: Normal range of motion and neck supple.     Comments: Upper / Lower Extremities: - Normal muscle tone, strength bilateral upper extremities 5/5, lower extremities 5/5  Lymphadenopathy:     Cervical: No cervical adenopathy.  Skin:    General: Skin is warm and dry.     Findings: No erythema or rash.  Neurological:     Mental Status: He is alert and oriented to person, place, and time.     Comments: Distal sensation intact to light touch all extremities  Psychiatric:        Behavior: Behavior normal.     Comments: Well groomed, good eye contact, normal speech and thoughts       Results for orders placed or performed in visit on 10/10/20  Hepatitis C antibody  Result Value Ref Range   Hepatitis C Ab NON-REACTIVE NON-REACTIVE   SIGNAL TO CUT-OFF 0.01 <1.00  PSA  Result Value Ref Range   PSA 0.65 < OR = 4.00 ng/mL  Lipid panel  Result Value Ref Range   Cholesterol 161 <200 mg/dL   HDL 48 > OR = 40 mg/dL   Triglycerides 181 (H) <150 mg/dL   LDL Cholesterol (Calc) 85 mg/dL (calc)   Total CHOL/HDL Ratio 3.4 <5.0 (calc)   Non-HDL Cholesterol (Calc) 113 <130 mg/dL (calc)  COMPLETE METABOLIC PANEL WITH GFR  Result Value Ref  Range   Glucose, Bld 122 (H) 65 - 99 mg/dL   BUN 13 7 - 25 mg/dL   Creat 0.93 0.70 - 1.33 mg/dL   GFR, Est Non African American 94 > OR = 60 mL/min/1.54m2   GFR, Est African American 109 > OR = 60 mL/min/1.39m2   BUN/Creatinine Ratio NOT APPLICABLE 6 - 22 (calc)   Sodium 140 135 - 146 mmol/L   Potassium 4.2 3.5 - 5.3 mmol/L   Chloride 104 98 - 110 mmol/L   CO2 28 20 - 32 mmol/L   Calcium 8.9 8.6 - 10.3 mg/dL   Total Protein 6.9 6.1 - 8.1 g/dL   Albumin 4.4 3.6 - 5.1 g/dL   Globulin 2.5 1.9 - 3.7 g/dL (calc)   AG Ratio 1.8 1.0 - 2.5 (calc)   Total Bilirubin 0.7 0.2 - 1.2 mg/dL   Alkaline phosphatase (APISO) 63 35 - 144 U/L   AST 28 10 - 35 U/L   ALT 33 9 - 46 U/L  CBC with Differential/Platelet  Result Value Ref Range   WBC 5.8 3.8 - 10.8 Thousand/uL   RBC 5.69 4.20 - 5.80 Million/uL   Hemoglobin 15.4 13.2 - 17.1 g/dL   HCT 48.3 38.5 - 50.0 %   MCV 84.9 80.0 - 100.0 fL   MCH 27.1 27.0 - 33.0 pg   MCHC 31.9 (L) 32.0 - 36.0 g/dL   RDW 13.9 11.0 - 15.0 %   Platelets 237 140 - 400 Thousand/uL   MPV 10.4 7.5 - 12.5 fL   Neutro Abs 3,782 1,500 - 7,800 cells/uL   Lymphs Abs 1,438 850 - 3,900 cells/uL   Absolute Monocytes 371 200 - 950 cells/uL   Eosinophils Absolute 168 15 - 500 cells/uL   Basophils Absolute 41 0 - 200 cells/uL   Neutrophils Relative % 65.2 %   Total Lymphocyte 24.8 %   Monocytes Relative 6.4 %  Eosinophils Relative 2.9 %   Basophils Relative 0.7 %  Hemoglobin A1c  Result Value Ref Range   Hgb A1c MFr Bld 6.2 (H) <5.7 % of total Hgb   Mean Plasma Glucose 131 mg/dL   eAG (mmol/L) 7.3 mmol/L      Assessment & Plan:   Problem List Items Addressed This Visit    Pre-diabetes    Increased A1c up to 6.2 - at risk of future T2DM/. Attributed to lifestyle poor choices Concern with obesity, HTN, HLD  Plan:  1. Not on any therapy currently  2. Encourage improved lifestyle - low carb, low sugar diet, reduce portion size, continue improving regular exercise -  emphasis on reducing sodas and carbs, he is ready to make a change. Declines any medication offered today we discussed metformin as option.      Morbid obesity ([place])    Mild gradual wt increase Encourage diet lifestyle exercise wt loss      Mixed hyperlipidemia    Mostly controlled cholesterol improved lifestyle, elevated TG still Last lipid panel 10/2020 The 10-year ASCVD risk score Mikey Bussing DC Jr., et al., 2013) is: 5.2%  Plan: 1. Reviewed ASCVD risk - for now he is at appropriate risk level, no indication for ASA / Statin currently 2. Encourage improved lifestyle - low carb/cholesterol, reduce portion size, start regular exercise once foot pain improved      Relevant Medications   losartan (COZAAR) 100 MG tablet   Family history of prostate cancer   Essential hypertension    Still elevated BP with HTN inadequate control - however he did not take BP med today out of med for period of time. Need home BP log again No known complications   Plan:  RESTART - Losartan 100mg  daily Advised that I am ready to start the rx Amlodipine add on, however since not on Losartan now we can defer 1 more time.  If still above  >140/90, can return sooner for BP follow up calibrate his own BP cuff here in office review readings May need 2nd agent in future Amlodipine Encourage improved lifestyle - low sodium diet, regular exercise - Reduce caffeine      Relevant Medications   losartan (COZAAR) 100 MG tablet    Other Visit Diagnoses    Annual physical exam    -  Primary   Acquired trigger finger of left middle finger       Palmar wart          Updated Health Maintenance information Reviewed recent lab results with patient Encouraged improvement to lifestyle with diet and exercise Goal of weight loss  #Wart Trial on OTC Topical treatment Otherwise return to Dermatology for more definitive management  #Trigger finger L Middle May return to [facility] as indicated, counseling provided,  may use topical nsaid and avoid repetitive overuse.   Meds ordered this encounter  Medications  . losartan (COZAAR) 100 MG tablet    Sig: Take 1 tablet (100 mg total) by mouth daily.    Dispense:  90 tablet    Refill:  1      Follow up plan: Return in about 4 months (around 02/15/2021) for 3-4 months follow-up PreDM A1c, HTN.  [name], [facility] 10/16/2020, 3:00 PM

## 2020-10-16 NOTE — Assessment & Plan Note (Signed)
Still elevated BP with HTN inadequate control - however he did not take BP med today out of med for period of time. Need home BP log again No known complications   Plan:  RESTART - Losartan 100mg  daily Advised that I am ready to start the rx Amlodipine add on, however since not on Losartan now we can defer 1 more time.  If still above  >140/90, can return sooner for BP follow up calibrate his own BP cuff here in office review readings May need 2nd agent in future Amlodipine Encourage improved lifestyle - low sodium diet, regular exercise - Reduce caffeine

## 2020-10-16 NOTE — Assessment & Plan Note (Signed)
Increased A1c up to 6.2 - at risk of future T2DM/. Attributed to lifestyle poor choices Concern with obesity, HTN, HLD  Plan:  1. Not on any therapy currently  2. Encourage improved lifestyle - low carb, low sugar diet, reduce portion size, continue improving regular exercise - emphasis on reducing sodas and carbs, he is ready to make a change. Declines any medication offered today we discussed metformin as option.

## 2020-12-26 ENCOUNTER — Other Ambulatory Visit: Payer: Self-pay | Admitting: Family Medicine

## 2020-12-26 DIAGNOSIS — M722 Plantar fascial fibromatosis: Secondary | ICD-10-CM

## 2020-12-26 NOTE — Telephone Encounter (Signed)
Requested Prescriptions  Pending Prescriptions Disp Refills  . meloxicam (MOBIC) 15 MG tablet [Pharmacy Med Name: MELOXICAM 15 MG TABLET] 90 tablet 0    Sig: TAKE 1 TABLET BY MOUTH EVERY DAY AS NEEDED FOR PAIN     Analgesics:  COX2 Inhibitors Passed - 12/26/2020  5:40 PM      Passed - HGB in normal range and within 360 days    Hemoglobin  Date Value Ref Range Status  10/10/2020 15.4 13.2 - 17.1 g/dL Final         Passed - Cr in normal range and within 360 days    Creat  Date Value Ref Range Status  10/10/2020 0.93 0.70 - 1.33 mg/dL Final    Comment:    For patients >7 years of age, the reference limit for Creatinine is approximately 13% higher for people identified as African-American. Renella Cunas - Patient is not pregnant      Passed - Valid encounter within last 12 months    Recent Outpatient Visits          2 months ago Annual physical exam   [facility] Olin Hauser, DO   5 months ago Pre-diabetes   [facility] Olin Hauser, DO   12 months ago Essential hypertension   [place], Devonne Doughty, DO   1 year ago Annual physical exam   [facility] Olin Hauser, DO   1 year ago Essential hypertension   [place], DO      Future Appointments            In 1 month [place] Ranger, Devonne Doughty, [facility], [facility]

## 2021-01-07 ENCOUNTER — Other Ambulatory Visit: Payer: Self-pay

## 2021-01-07 ENCOUNTER — Ambulatory Visit (INDEPENDENT_AMBULATORY_CARE_PROVIDER_SITE_OTHER): Payer: BC Managed Care – PPO | Admitting: Dermatology

## 2021-01-07 DIAGNOSIS — Z1283 Encounter for screening for malignant neoplasm of skin: Secondary | ICD-10-CM

## 2021-01-07 DIAGNOSIS — B078 Other viral warts: Secondary | ICD-10-CM

## 2021-01-07 DIAGNOSIS — L814 Other melanin hyperpigmentation: Secondary | ICD-10-CM | POA: Diagnosis not present

## 2021-01-07 DIAGNOSIS — Z86018 Personal history of other benign neoplasm: Secondary | ICD-10-CM | POA: Diagnosis not present

## 2021-01-07 DIAGNOSIS — L578 Other skin changes due to chronic exposure to nonionizing radiation: Secondary | ICD-10-CM

## 2021-01-07 DIAGNOSIS — L821 Other seborrheic keratosis: Secondary | ICD-10-CM

## 2021-01-07 DIAGNOSIS — L820 Inflamed seborrheic keratosis: Secondary | ICD-10-CM

## 2021-01-07 DIAGNOSIS — D1800 Hemangioma unspecified site: Secondary | ICD-10-CM

## 2021-01-07 DIAGNOSIS — D229 Melanocytic nevi, unspecified: Secondary | ICD-10-CM

## 2021-01-07 NOTE — Patient Instructions (Addendum)
Melanoma ABCDEs  Melanoma is the most dangerous type of skin cancer, and is the leading cause of death from skin disease.  You are more likely to develop melanoma if you: Have light-colored skin, light-colored eyes, or red or blond hair Spend a lot of time in the sun Tan regularly, either outdoors or in a tanning bed Have had blistering sunburns, especially during childhood Have a close family member who has had a melanoma Have atypical moles or large birthmarks  Early detection of melanoma is key since treatment is typically straightforward and cure rates are extremely high if we catch it early.   The first sign of melanoma is often a change in a mole or a new dark spot.  The ABCDE system is a way of remembering the signs of melanoma.  A for asymmetry:  The two halves do not match. B for border:  The edges of the growth are irregular. C for color:  A mixture of colors are present instead of an even brown color. D for diameter:  Melanomas are usually (but not always) greater than 17m - the size of a pencil eraser. E for evolution:  The spot keeps changing in size, shape, and color.  Please check your skin once per month between visits. You can use a small mirror in front and a large mirror behind you to keep an eye on the back side or your body.   If you see any new or changing lesions before your next follow-up, please call to schedule a visit.  Please continue daily skin protection including broad spectrum sunscreen SPF 30+ to sun-exposed areas, reapplying every 2 hours as needed when you're outdoors.     If you have any questions or concerns for your doctor, please call our main line at [phone] press option 4 to reach your doctor's medical assistant. If no one answers, please leave a voicemail as directed and we will return your call as soon as possible. Messages left after 4 pm will be answered the following business day.   You may also send [place] message via [place] [facility]  typically respond to MyChart messages within 1-2 business days.  For prescription refills, please ask your pharmacy to contact our office. Our fax number is [phone]  If you have an urgent issue when the clinic is closed that cannot wait until the next business day, you can page your doctor at the number below.    Please note that while we do our best to be available for urgent issues outside of office hours, we are not available 24/7.   If you have an urgent issue and are unable to reach [place] you may choose to seek medical care at your doctor's office, retail clinic, urgent care center, or emergency room.  If you have a medical emergency, please immediately call 911 or go to the emergency department.  Pager Numbers  - Dr. KNehemiah Massed [phone] - Dr. MLaurence Ferrari [phone] - Dr. SNicole Kindred [phone] In the event of inclement weather, please call our main line at [phone] an update on the status of any delays or closures.  Dermatology Medication Tips: Please keep the boxes that topical medications come in in order to help keep track of the instructions about where and how to use these. Pharmacies typically print the medication instructions only on the boxes and not directly on the medication tubes.   If your medication is too expensive, please contact our office at [phone] 4 or  send [place] a message through [place].   We are unable to tell what your co-pay for medications will be in advance as this is different depending on your insurance coverage. However, we may be able to find a substitute medication at lower cost or fill out paperwork to get insurance to cover a needed medication.   If a prior authorization is required to get your medication covered by your insurance company, please allow [place] 1-2 business days to complete this process.  Drug prices often vary depending on where the prescription is filled and some pharmacies may offer cheaper prices.  The  website www.goodrx.com contains coupons for medications through different pharmacies. The prices here do not account for what the cost may be with help from insurance (it may be cheaper with your insurance), but the website can give you the price if you did not use any insurance.  - You can print the associated coupon and take it with your prescription to the pharmacy.  - You may also stop by our office during regular business hours and pick up a GoodRx coupon card.  - If you need your prescription sent electronically to a different pharmacy, notify our office through [facility] or by phone at [phone] option 4.   Cryotherapy Aftercare  Wash gently with soap and water everyday.   Apply Vaseline and Band-Aid daily until healed.   Recommend Urea cream 15% to dry area at right thumb

## 2021-01-07 NOTE — Progress Notes (Signed)
Follow-Up Visit   Subjective  Michael Taylor is a 50 y.o. male who presents for the following: Annual Exam (Patient here for full body skin exam and skin cancer screening. Patient with hx of dysplastic nevi. He does have a spot at back that may be new but he can not see it very well. He also has a dry spot at right thumb, he has been using lotion on it but it continues to be dry. ).  The following portions of the chart were reviewed this encounter and updated as appropriate:   Tobacco  Allergies  Meds  Problems  Med Hx  Surg Hx  Fam Hx     Review of Systems:  No other skin or systemic complaints except as noted in HPI or Assessment and Plan.  Objective  Well appearing patient in no apparent distress; mood and affect are within normal limits.  A full examination was performed including scalp, head, eyes, ears, nose, lips, neck, chest, axillae, abdomen, back, buttocks, bilateral upper extremities, bilateral lower extremities, hands, feet, fingers, toes, fingernails, and toenails. All findings within normal limits unless otherwise noted below.  right groin x 1, right side x 2 (3) Erythematous keratotic or waxy stuck-on papule or plaque.   Left middle finger Verrucous papules -- Discussed viral etiology and contagion.    Assessment & Plan  Inflamed seborrheic keratosis right groin x 1, right side x 2  Destruction of lesion - right groin x 1, right side x 2 Complexity: simple   Destruction method: cryotherapy   Informed consent: discussed and consent obtained   Timeout:  patient name, date of birth, surgical site, and procedure verified Lesion destroyed using liquid nitrogen: Yes   Region frozen until ice ball extended beyond lesion: Yes   Outcome: patient tolerated procedure well with no complications   Post-procedure details: wound care instructions given    Other viral warts Left middle finger  Discussed viral etiology and risk of spread.  Discussed multiple  treatments may be required to clear warts.  Discussed possible post-treatment dyspigmentation and risk of recurrence.  Destruction of lesion - Left middle finger Complexity: simple   Destruction method: cryotherapy   Informed consent: discussed and consent obtained   Timeout:  patient name, date of birth, surgical site, and procedure verified Lesion destroyed using liquid nitrogen: Yes   Region frozen until ice ball extended beyond lesion: Yes   Outcome: patient tolerated procedure well with no complications   Post-procedure details: wound care instructions given    Skin cancer screening  History of Dysplastic Nevi - No evidence of recurrence today - Recommend regular full body skin exams - Recommend daily broad spectrum sunscreen SPF 30+ to sun-exposed areas, reapply every 2 hours as needed.  - Call if any new or changing lesions are noted between office visits  Lentigines - Scattered tan macules - Due to sun exposure - Benign-appering, observe - Recommend daily broad spectrum sunscreen SPF 30+ to sun-exposed areas, reapply every 2 hours as needed. - Call for any changes  Seborrheic Keratoses - Stuck-on, waxy, tan-brown papules and/or plaques  - Benign-appearing - Discussed benign etiology and prognosis. - Observe - Call for any changes  Melanocytic Nevi - Tan-brown and/or pink-flesh-colored symmetric macules and papules - Benign appearing on exam today - Observation - Call clinic for new or changing moles - Recommend daily use of broad spectrum spf 30+ sunscreen to sun-exposed areas.   Hemangiomas - Red papules - Discussed benign nature - Observe -  Call for any changes  Actinic Damage - Chronic condition, secondary to cumulative UV/sun exposure - diffuse scaly erythematous macules with underlying dyspigmentation - Recommend daily broad spectrum sunscreen SPF 30+ to sun-exposed areas, reapply every 2 hours as needed.  - Staying in the shade or wearing long sleeves,  sun glasses (UVA+UVB protection) and wide brim hats (4-inch brim around the entire circumference of the hat) are also recommended for sun protection.  - Call for new or changing lesions.  Skin cancer screening performed today.  Return in about 1 year (around 01/07/2022) for TBSE.  Graciella Belton, RMA, am acting as scribe for Sarina Ser, MD . Documentation: I have reviewed the above documentation for accuracy and completeness, and I agree with the above.  Sarina Ser, MD

## 2021-01-08 ENCOUNTER — Encounter: Payer: Self-pay | Admitting: Dermatology

## 2021-02-20 ENCOUNTER — Encounter: Payer: Self-pay | Admitting: Family Medicine

## 2021-02-20 ENCOUNTER — Ambulatory Visit (INDEPENDENT_AMBULATORY_CARE_PROVIDER_SITE_OTHER): Payer: BC Managed Care – PPO | Admitting: Family Medicine

## 2021-02-20 ENCOUNTER — Other Ambulatory Visit: Payer: Self-pay

## 2021-02-20 VITALS — BP 150/90 | HR 90 | Ht 69.0 in | Wt 281.6 lb

## 2021-02-20 DIAGNOSIS — I10 Essential (primary) hypertension: Secondary | ICD-10-CM

## 2021-02-20 DIAGNOSIS — R7303 Prediabetes: Secondary | ICD-10-CM

## 2021-02-20 LAB — POCT GLYCOSYLATED HEMOGLOBIN (HGB A1C): Hemoglobin A1C: 6.1 % — AB (ref 4.0–5.6)

## 2021-02-20 MED ORDER — AMLODIPINE BESYLATE 10 MG PO TABS: 10.0000 mg | ORAL_TABLET | Freq: Every day | ORAL | 3 refills | Status: DC

## 2021-02-20 NOTE — Assessment & Plan Note (Signed)
Mild improved A1c to 6.1 at risk of future T2DM/. Attributed to lifestyle poor choices Concern with obesity, HTN, HLD  Plan:  1. Not on any therapy currently  2. Encourage improved lifestyle - low carb, low sugar diet, reduce portion size, continue improving regular exercise - emphasis on reducing sodas and carbs, he is ready to make a change. Declines any medication offered today we discussed metformin as option.

## 2021-02-20 NOTE — Patient Instructions (Addendum)
Thank you for coming to the office today.  Start taking new med Amlodipine 10mg  daily in morning Continue other medication Losartan still  Amlodipine can cause mild swelling and edema lower extremity ankle  Use RICE therapy: - R - Rest / relative rest with activity modification avoid overuse of joint - I - Ice packs (make sure you use a towel or sock / something to protect skin) - C - Compression with Socks/Stockings to apply pressure and reduce swelling allowing more support - E - Elevation - if significant swelling, lift leg above heart level (toes above your nose) to help reduce swelling, most helpful at night after day of being on your feet  Consider this for the HIP as well - may have some nerve impingement or orthopedic symptoms We can refer and do x-ray if need just let me know  START anti inflammatory topical - OTC Voltaren (generic Diclofenac) topical 2-4 times a day as needed for pain swelling of affected joint for 1-2 weeks or longer.   Please schedule a Follow-up Appointment to: Return in about 4 months (around 06/23/2021) for 4 month follow-up Pre DM A1c, HTN med adjust, Hip Pain.  If you have any other questions or concerns, please feel free to call the office or send a message through [place]. You may also schedule an earlier appointment if necessary.  Additionally, you may be receiving a survey about your experience at our office within a few days to 1 week by e-mail or mail. We value your feedback.  [name], DO [place]

## 2021-02-20 NOTE — Progress Notes (Signed)
Subjective:    Patient ID: Michael Taylor, male    DOB: [birth year], 50 y.o.   MRN: [id number]  Michael Taylor is a 50 y.o. male presenting on 02/20/2021 for Prediabetes and Hypertension   HPI  CHRONIC HTN / Morbid Obesity BMI >41 Previous visit still had elevated BP, Losartan inc from 50 to 100mg  See prior notes for background information. Reviewed home BP readings 140-150 avg Current Meds - Losartan 100mg  daily Adhering to medication better now Tolerating well, w/o complaints. 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 - No range of motion problems. - Has Voltaren PRN for fingers Not impacting day to day function    Pre Diabetes A1c 6.2 previous 10/10/2020 Due today     Health Maintenance:  Considering COVID19 Booster updated version  Due for Flu Shot, declines today despite counseling on benefits   Depression screen [facility] 2/9 10/16/2020 12/28/2019 09/07/2019  Decreased Interest 0 0 0  Down, Depressed, Hopeless 0 0 0  PHQ - 2 Score 0 0 0  Altered sleeping 0 - -  Tired, decreased energy 0 - -  Change in appetite 0 - -  Feeling bad or failure about yourself  0 - -  Trouble concentrating 0 - -  Moving slowly or fidgety/restless 0 - -  Suicidal thoughts 0 - -  PHQ-9 Score 0 - -  Difficult doing work/chores Not difficult at all - -    Social History   Tobacco Use   Smoking status: Never   Smokeless tobacco: Never  Vaping Use   Vaping Use: Never used  Substance Use Topics   Alcohol use: Yes    Alcohol/week: 4.0 standard drinks    Types: 4 Shots of liquor per week    Comment:  rare, < 1 x monthly   Drug use: No    Review of Systems Per HPI unless specifically indicated above     Objective:    BP (!) 150/90 (BP Location: Left Arm, Cuff Size: Normal)   Pulse 90   Ht 5\' 9"  (1.753 m)   Wt 281 lb 9.6 oz (127.7 kg)   SpO2 100%   BMI 41.59 kg/m   Wt Readings from Last 3 Encounters:  02/20/21 281 lb 9.6 oz (127.7 kg)  10/16/20 274 lb 9.6 oz (124.6 kg)  07/11/20 270 lb 3.2 oz (122.6 kg)    Physical Exam Vitals and nursing note reviewed.  Constitutional:      General: He is not in acute distress.    Appearance: Normal appearance. He is well-developed. He is obese. He is not diaphoretic.     Comments: Well-appearing, comfortable, cooperative  HENT:     Head: Normocephalic and atraumatic.  Eyes:     General:        Right eye: No discharge.        Left eye: No discharge.     Conjunctiva/sclera: Conjunctivae normal.  Cardiovascular:     Rate and Rhythm: Normal rate.  Pulmonary:     Effort: Pulmonary effort is normal.  Skin:    General: Skin is warm and dry.     Findings: No erythema or rash.  Neurological:     Mental Status: He is alert and oriented to person, place, and time.  Psychiatric:        Mood and Affect: Mood normal.        Behavior: Behavior normal.        Thought Content: Thought content normal.     Comments: Well groomed, good eye contact, normal speech and thoughts        Results for orders placed or performed in visit on 02/20/21  POCT glycosylated hemoglobin (Hb A1C)  Result Value Ref Range   Hemoglobin A1C 6.1 (A) 4.0 - 5.6 %      Assessment & Plan:   Problem List Items Addressed This Visit     Pre-diabetes - Primary    Mild improved A1c to 6.1 at risk of future T2DM/. Attributed to lifestyle poor choices Concern with obesity, HTN, HLD  Plan:  1. Not on any therapy currently  2. Encourage improved lifestyle - low carb, low sugar diet, reduce portion size, continue improving regular exercise - emphasis on reducing  sodas and carbs, he is ready to make a change. Declines any medication offered today we discussed metformin as option.      Relevant Orders   POCT glycosylated hemoglobin (Hb A1C) (Completed)   Morbid obesity (HCC)    Mild gradual wt increase Encourage diet lifestyle exercise wt loss      Essential hypertension    Still elevated BP with HTN inadequate control Home BP 140-150 range No known complications   Plan:  START new med Amlodipine 10mg  daily, counsel on benefit potential side effect Continue Losartan 100mg  daily If still above  >140/90, can return sooner for BP follow up calibrate his own BP cuff here in office review readings Encourage improved lifestyle - low sodium diet, regular exercise - Reduce caffeine      Relevant Medications   amLODipine (NORVASC) 10 MG tablet      Meds ordered this encounter  Medications   amLODipine (NORVASC) 10 MG tablet    Sig: Take 1 tablet (10 mg total) by mouth daily.    Dispense:  90 tablet    Refill:  3      Follow up plan: Return in about 4 months (around 06/23/2021) for 4 month follow-up Pre DM A1c, HTN med adjust, Hip Pain.  [name], [facility] 02/20/2021, 2:35 PM

## 2021-02-20 NOTE — Assessment & Plan Note (Signed)
Still elevated BP with HTN inadequate control Home BP 140-150 range No known complications   Plan:  START new med Amlodipine 10mg  daily, counsel on benefit potential side effect Continue Losartan 100mg  daily If still above  >140/90, can return sooner for BP follow up calibrate his own BP cuff here in office review readings Encourage improved lifestyle - low sodium diet, regular exercise - Reduce caffeine

## 2021-02-20 NOTE — Assessment & Plan Note (Signed)
Mild gradual wt increase Encourage diet lifestyle exercise wt loss

## 2021-03-25 ENCOUNTER — Other Ambulatory Visit: Payer: Self-pay | Admitting: Family Medicine

## 2021-03-25 DIAGNOSIS — M722 Plantar fascial fibromatosis: Secondary | ICD-10-CM

## 2021-03-25 NOTE — Telephone Encounter (Signed)
Requested Prescriptions  Pending Prescriptions Disp Refills  . meloxicam (MOBIC) 15 MG tablet [Pharmacy Med Name: MELOXICAM 15 MG TABLET] 90 tablet 0    Sig: TAKE 1 TABLET BY MOUTH EVERY DAY AS NEEDED FOR PAIN     Analgesics:  COX2 Inhibitors Passed - 03/25/2021  1:35 AM      Passed - HGB in normal range and within 360 days    Hemoglobin  Date Value Ref Range Status  10/10/2020 15.4 13.2 - 17.1 g/dL Final         Passed - Cr in normal range and within 360 days    Creat  Date Value Ref Range Status  10/10/2020 0.93 0.70 - 1.33 mg/dL Final    Comment:    For patients >75 years of age, the reference limit for Creatinine is approximately 13% higher for people identified as African-American. Renella Cunas - Patient is not pregnant      Passed - Valid encounter within last 12 months    Recent Outpatient Visits          1 month ago Pre-diabetes   [facility] [place], Devonne Doughty, DO   5 months ago Annual physical exam   [facility] Olin Hauser, DO   8 months ago Pre-diabetes   [facility] [place], Devonne Doughty, DO   1 year ago Essential hypertension   [facility] Olin Hauser, DO   1 year ago Annual physical exam   [facility] Olin Hauser, DO      Future Appointments            In 3 months [place], Devonne Doughty, DO [facility], [place]   In 9 months Ralene Bathe, MD [place]

## 2021-06-26 ENCOUNTER — Ambulatory Visit: Payer: BC Managed Care – PPO | Admitting: Family Medicine

## 2021-06-28 ENCOUNTER — Other Ambulatory Visit: Payer: Self-pay | Admitting: Family Medicine

## 2021-06-28 DIAGNOSIS — M722 Plantar fascial fibromatosis: Secondary | ICD-10-CM

## 2021-06-28 NOTE — Telephone Encounter (Signed)
Requested Prescriptions  Pending Prescriptions Disp Refills   meloxicam (MOBIC) 15 MG tablet [Pharmacy Med Name: MELOXICAM 15 MG TABLET] 90 tablet 0    Sig: TAKE 1 TABLET BY MOUTH EVERY DAY AS NEEDED FOR PAIN     Analgesics:  COX2 Inhibitors Failed - 06/28/2021  2:09 AM      Failed - Manual Review: Labs are only required if the patient has taken medication for more than 8 weeks.      Passed - HGB in normal range and within 360 days    Hemoglobin  Date Value Ref Range Status  10/10/2020 15.4 13.2 - 17.1 g/dL Final         Passed - Cr in normal range and within 360 days    Creat  Date Value Ref Range Status  10/10/2020 0.93 0.70 - 1.33 mg/dL Final    Comment:    For patients >66 years of age, the reference limit for Creatinine is approximately 13% higher for people identified as African-American. .          Passed - HCT in normal range and within 360 days    HCT  Date Value Ref Range Status  10/10/2020 48.3 38.5 - 50.0 % Final         Passed - AST in normal range and within 360 days    AST  Date Value Ref Range Status  10/10/2020 28 10 - 35 U/L Final         Passed - ALT in normal range and within 360 days    ALT  Date Value Ref Range Status  10/10/2020 33 9 - 46 U/L Final         Passed - eGFR is 30 or above and within 360 days    GFR, Est African American  Date Value Ref Range Status  10/10/2020 109 > OR = 60 mL/min/1.34m Final   GFR, Est Non African American  Date Value Ref Range Status  10/10/2020 94 > OR = 60 mL/min/1.748mFinal   GFR  Date Value Ref Range Status  10/03/2011 94.25 >60.00 mL/min Final         Passed - Patient is not pregnant      Passed - Valid encounter within last 12 months    Recent Outpatient Visits          4 months [facility]di[facility]   8 months [facility]   11 months ago Pre-diabetes   [place]   1 year ago Essential hypertension   [place]   1 year ago Annual physical exam   [place]      Future Appointments            In 4 days [place] [facility] In 6 months Ko[place]alene Bathe[place]

## 2021-07-02 ENCOUNTER — Other Ambulatory Visit: Payer: Self-pay

## 2021-07-02 ENCOUNTER — Ambulatory Visit: Payer: BC Managed Care – PPO | Admitting: Family Medicine

## 2021-07-02 VITALS — BP 151/84 | HR 85 | Ht 69.0 in | Wt 280.8 lb

## 2021-07-02 DIAGNOSIS — R7303 Prediabetes: Secondary | ICD-10-CM

## 2021-07-02 DIAGNOSIS — I10 Essential (primary) hypertension: Secondary | ICD-10-CM | POA: Diagnosis not present

## 2021-07-02 DIAGNOSIS — G5711 Meralgia paresthetica, right lower limb: Secondary | ICD-10-CM

## 2021-07-02 DIAGNOSIS — R202 Paresthesia of skin: Secondary | ICD-10-CM

## 2021-07-02 DIAGNOSIS — M159 Polyosteoarthritis, unspecified: Secondary | ICD-10-CM

## 2021-07-02 LAB — POCT GLYCOSYLATED HEMOGLOBIN (HGB A1C): Hemoglobin A1C: 7 % — AB (ref 4.0–5.6)

## 2021-07-02 MED ORDER — BACLOFEN 10 MG PO TABS: 5.0000 mg | ORAL_TABLET | Freq: Three times a day (TID) | ORAL | 2 refills | Status: DC | PRN

## 2021-07-02 MED ORDER — METFORMIN HCL 500 MG PO TABS: 500.0000 mg | ORAL_TABLET | Freq: Two times a day (BID) | ORAL | 3 refills | Status: DC

## 2021-07-02 NOTE — Progress Notes (Signed)
Subjective:    Patient ID: Michael Taylor, male    DOB: [birth year], 51 y.o.   MRN: [id number]  Michael Taylor is a 51 y.o. male presenting on 07/02/2021 for Diabetes, Hypertension, and Hip Pain   HPI  CHRONIC HTN / Morbid Obesity BMI >41 Last visit 02/2021 improved on BP med See prior notes for background information. He is improving his compliance and adherence, weekly pill pack. Current Meds - Losartan 100mg  daily, Amlodipine 10mg  daily Adhering to medication better now Tolerating well, w/o complaints. Denies CP, dyspnea, HA, edema, dizziness / lightheadedness  PreDM  A1c prior elevated 6 range Today due for A1c  Right Hip Pain, groin Suspected Meralgia Paresthetica  Worse with prolonged sitting and activity. Has numbness tingling stinging burning pain localized and into R thigh  Right Upper Extremity Paresthesia R hand dominant Describes R hand with some grip weakness and some atrophy of the R tricep, and he has pins and needles, difficulty straightening his tricep behind him. Says difficulty with throwing motion. Initially some neck stiffness. He said first noticed issue when he flew back in December 2022 - He used to be able to pitch and throw well and not have issues - Limited range of motion as well above shoulder and even behind back. - He had previous evaluation back in 07/2020 at [facility] Dr Harlow Mares for same issue R shoulder but at that time was not having impingement paresthesia.  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 - No range of motion problems. - Has Voltaren PRN for fingers   History of SCFE surgery as teenager Worse with prolonged standing on feet, if prolong sit can cause impingement in R groin hip pain.   taking Meloxicam 15mg  daily PRN   Depression screen [facility] 2  2/9 10/16/2020 12/28/2019 09/07/2019  Decreased Interest 0 0 0  Down, Depressed, Hopeless 0 0 0  PHQ - 2 Score 0 0 0  Altered sleeping 0 - -  Tired, decreased energy 0 - -  Change in appetite 0 - -  Feeling bad or failure about yourself  0 - -  Trouble concentrating 0 - -  Moving slowly or fidgety/restless 0 - -  Suicidal thoughts 0 - -  PHQ-9 Score 0 - -  Difficult doing work/chores Not difficult at all - -    Social History   Tobacco Use   Smoking status: Never   Smokeless tobacco: Never  Vaping Use   Vaping Use: Never used  Substance Use Topics   Alcohol use: Yes    Alcohol/week: 4.0 standard drinks    Types: 4 Shots of liquor per week    Comment: rare, < 1 x monthly   Drug use: No    Review of Systems Per HPI unless specifically indicated above     Objective:    Ht 5\' 9"  (1.753 m)    Wt 280 lb 12.8 oz (127.4 kg)    BMI 41.47 kg/m   Wt Readings from Last 3 Encounters:  07/02/21 280 lb 12.8 oz (127.4 kg)  02/20/21 281 lb 9.6 oz (127.7 kg)  10/16/20 274 lb 9.6 oz (124.6 kg)    Physical Exam Vitals and nursing note reviewed.  Constitutional:      General: He is not in acute distress.    Appearance: He is well-developed. He is not diaphoretic.  Comments: Well-appearing, comfortable, cooperative  HENT:     Head: Normocephalic and atraumatic.  Eyes:     General:        Right eye: No discharge.        Left eye: No discharge.     Conjunctiva/sclera: Conjunctivae normal.  Neck:     Thyroid: No thyromegaly.  Cardiovascular:     Rate and Rhythm: Normal rate and regular rhythm.     Pulses: Normal pulses.     Heart sounds: Normal heart sounds. No murmur heard. Pulmonary:     Effort: Pulmonary effort is normal. No respiratory distress.     Breath sounds: Normal breath sounds. No wheezing or rales.  Musculoskeletal:     Cervical back: Normal range of motion and neck supple.     Comments: Reduced ROM R shoulder above shoulder and behind back tricep  extension  Some mild reduced muscle mass vs atrophy R upper ext  Lymphadenopathy:     Cervical: No cervical adenopathy.  Skin:    General: Skin is warm and dry.     Findings: No erythema or rash.  Neurological:     Mental Status: He is alert and oriented to person, place, and time. Mental status is at baseline.     Comments: Reduced nerve sensation to light touch R hand median nerve distribution   Psychiatric:        Behavior: Behavior normal.     Comments: Well groomed, good eye contact, normal speech and thoughts   Results for orders placed or performed in visit on 07/02/21  POCT glycosylated hemoglobin (Hb A1C)  Result Value Ref Range   Hemoglobin A1C 7.0 (A) 4.0 - 5.6 %      Assessment & Plan:   Problem List Items Addressed This Visit     Primary osteoarthritis involving multiple joints   Relevant Medications   baclofen (LIORESAL) 10 MG tablet   Other Relevant Orders   Ambulatory referral to Neurology   Pre-diabetes - Primary   Relevant Medications   metFORMIN (GLUCOPHAGE) 500 MG tablet   Other Relevant Orders   POCT glycosylated hemoglobin (Hb A1C) (Completed)   Morbid obesity (HCC)   Relevant Medications   metFORMIN (GLUCOPHAGE) 500 MG tablet   Essential hypertension   Other Visit Diagnoses     Paresthesia of right arm       Relevant Medications   baclofen (LIORESAL) 10 MG tablet   Other Relevant Orders   Ambulatory referral to Neurology   Meralgia paraesthetica, right       Relevant Medications   baclofen (LIORESAL) 10 MG tablet   Other Relevant Orders   Ambulatory referral to Neurology       PreDM vs risk of progression to DM2   Start Metformin 500 twice a day with meal, may go with 1 pill daily then up to 2 eventually  Goal to limit carbs starches sugars in diet  Handout on GLP1 therapy and other options in future if DM2  R Meralgia paresthetica Counseling provided  For Paresthesia RUE Likely nerve impingement entrapment RUE - suspect  shoulder  Start taking Baclofen (Lioresal) 10mg  (muscle relaxant) - start with half (cut) to one whole pill at night as needed for next 1-3 nights (may make you drowsy, caution with driving) see how it affects you, then if tolerated increase to one pill 2 to 3 times a day or (every 8 hours as needed)  [facility] - Neurology Dept [place], [place] [address] Phone: [phone]  referral to Neurology for 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 He has had muscle atrophy as well.   Orders Placed This Encounter  Procedures   Ambulatory referral to Neurology    Referral Priority:   Routine    Referral Type:   Consultation    Referral Reason:   Specialty Services Required    Requested Specialty:   Neurology    Number of Visits Requested:   1   POCT glycosylated hemoglobin (Hb A1C)     Meds ordered this encounter  Medications   metFORMIN (GLUCOPHAGE) 500 MG tablet    Sig: Take 1 tablet (500 mg total) by mouth 2 (two) times daily with a meal.    Dispense:  180 tablet    Refill:  3   baclofen (LIORESAL) 10 MG tablet    Sig: Take 0.5-1 tablets (5-10 mg total) by mouth 3 (three) times daily as needed for muscle spasms.    Dispense:  30 each    Refill:  2      Follow up plan: Return in about 4 months (around 10/30/2021) for 4 month PreDM A1c, HTN, R Shoulder impingement/paresthesia.   [name], [facility] 07/02/2021, 3:00 PM

## 2021-07-02 NOTE — Patient Instructions (Addendum)
Thank you for coming to the office today.  Recent Labs    10/10/20 0827 02/20/21 1655 07/02/21 1512  HGBA1C 6.2* 6.1* 7.0*   Start Metformin 500 twice a day with meal, may go with 1 pill daily then up to 2 eventually  Goal to limit carbs starches sugars in diet  Start taking Baclofen (Lioresal) 10mg  (muscle relaxant) - start with half (cut) to one whole pill at night as needed for next 1-3 nights (may make you drowsy, caution with driving) see how it affects you, then if tolerated increase to one pill 2 to 3 times a day or (every 8 hours as needed)  [facility] - Neurology Dept [place], [place] [address] Phone: [phone]  Referral sent  Call insurance find cost and coverage of the following - check the following: - Drug Tier, Preferred List, On Formulary - All will require a "Prior Authorization" from [place] first, before you can find out the cost - Find out if there is "Step Therapy" (other medicines required before you can try these)  Once you pick the one you want to try, let me know - we can get a sample ready IF we have it in stock. Then try it - and before running out of medicine, contact me back to order your Rx so we have time to get it processed.  For Pre-Diabetes / Diabetes   1. Ozempic (Semaglutide injection) - start 0.25mg  weekly for 4 weeks then increase to 0.5mg  weekly, sample is 6 doses, re-use the same pen until empty, new needle each dose.   2. Trulicity (Dulaglutide) - once weekly 0.75 (likely we would start) and 1.5 max dose, sample is 2 doses, 1 dose per pen, each pen is a one time use, no visible needle, it is an auto-injector.  Rybelsus oral pill version of ozempic  ----------  For Weight Loss / Obesity only  Wegovy (same as Ozempic) weekly injection - start 0.25mg  weekly, 1 dose per pen, single use, auto-injector  2. Saxenda - DAILY injection - start 0.6mg  injection DAILY, sample is 3 weeks, new needle each dose.   3  benefits - 1 significantly reduced A1c sugar, and may be able to reduce or stop metformin in future - 2 reduced appetite and weight loss with good results - 3 cardiovascular risk reduction, less likely to have heart attack/stroke    ---------------------------------------------    Please schedule a Follow-up Appointment to: Return in about 4 months (around 10/30/2021) for 4 month PreDM A1c, HTN, R Shoulder impingement/paresthesia.  If you have any other questions or concerns, please feel free to call the office or send a message through [place]. You may also schedule an earlier appointment if necessary.  Additionally, you may be receiving a survey about your experience at our office within a few days to 1 week by e-mail or mail. We value your feedback.  [name], DO [place]

## 2021-07-24 ENCOUNTER — Other Ambulatory Visit: Payer: Self-pay | Admitting: Family Medicine

## 2021-07-24 DIAGNOSIS — I10 Essential (primary) hypertension: Secondary | ICD-10-CM

## 2021-07-24 NOTE — Telephone Encounter (Signed)
Requested medications are due for refill today.  yes ? ?Requested medications are on the active medications list.  yes ? ?Last refill. 10/16/2020 #90 1 refill ? ?Future visit scheduled.   yes ? ?Notes to clinic.  Refill failed protocol d/t expired labs. ? ? ? ?Requested Prescriptions  ?Pending Prescriptions Disp Refills  ? losartan (COZAAR) 100 MG tablet [Pharmacy Med Name: LOSARTAN POTASSIUM 100 MG TAB] 90 tablet 1  ?  Sig: TAKE 1 TABLET BY MOUTH EVERY DAY  ?  ? Cardiovascular:  Angiotensin Receptor Blockers Failed - 07/24/2021  2:01 AM  ?  ?  Failed - Cr in normal range and within 180 days  ?  Creat  ?Date Value Ref Range Status  ?10/10/2020 0.93 0.70 - 1.33 mg/dL Final  ?  Comment:  ?  For patients >16 years of age, the reference limit ?for Creatinine is approximately 13% higher for people ?identified as African-American. ?. ?  ?  ?  ?  ?  Failed - K in normal range and within 180 days  ?  Potassium  ?Date Value Ref Range Status  ?10/10/2020 4.2 3.5 - 5.3 mmol/L Final  ?  ?  ?  ?  Failed - Last BP in normal range  ?  BP Readings from Last 1 Encounters:  ?07/02/21 (!) 151/84  ?  ?  ?  ?  Passed - Patient is not pregnant  ?  ?  Passed - Valid encounter within last 6 months  ?  Recent Outpatient Visits   ? ?      ? 3 weeks ago Pre-diabetes  ? [place], DO  ? 5 months ago Pre-diabetes  ? [employer], DO  ? 9 months ago Annual physical exam  ? [place], DO  ? 1 year ago Pre-diabetes  ? [place], DO  ? 1 year ago Essential hypertension  ? [place], DO  ? ?  ?  ?Future Appointments   ? ?        ? In 2 months Parks Ranger, [facility], [place]  ? In 5 months Ralene Bathe, MD [place]  ? ?  ? ?  ?  ?  ?  ?

## 2021-09-24 ENCOUNTER — Other Ambulatory Visit: Payer: Self-pay | Admitting: Family Medicine

## 2021-09-24 DIAGNOSIS — M722 Plantar fascial fibromatosis: Secondary | ICD-10-CM

## 2021-09-25 NOTE — Telephone Encounter (Signed)
Requested Prescriptions  ?Pending Prescriptions Disp Refills  ?? meloxicam (MOBIC) 15 MG tablet [Pharmacy Med Name: MELOXICAM 15 MG TABLET] 90 tablet 0  ?  Sig: TAKE 1 TABLET BY MOUTH EVERY DAY AS NEEDED FOR PAIN  ?  ? Analgesics:  COX2 Inhibitors Failed - 09/24/2021  2:19 AM  ?  ?  Failed - Manual Review: Labs are only required if the patient has taken medication for more than 8 weeks.  ?  ?  Passed - HGB in normal range and within 360 days  ?  Hemoglobin  ?Date Value Ref Range Status  ?10/10/2020 15.4 13.2 - 17.1 g/dL Final  ?   ?  ?  Passed - Cr in normal range and within 360 days  ?  Creat  ?Date Value Ref Range Status  ?10/10/2020 0.93 0.70 - 1.33 mg/dL Final  ?  Comment:  ?  For patients >62 years of age, the reference limit ?for Creatinine is approximately 13% higher for people ?identified as African-American. ?. ?  ?   ?  ?  Passed - HCT in normal range and within 360 days  ?  HCT  ?Date Value Ref Range Status  ?10/10/2020 48.3 38.5 - 50.0 % Final  ?   ?  ?  Passed - AST in normal range and within 360 days  ?  AST  ?Date Value Ref Range Status  ?10/10/2020 28 10 - 35 U/L Final  ?   ?  ?  Passed - ALT in normal range and within 360 days  ?  ALT  ?Date Value Ref Range Status  ?10/10/2020 33 9 - 46 U/L Final  ?   ?  ?  Passed - eGFR is 30 or above and within 360 days  ?  GFR, Est African American  ?Date Value Ref Range Status  ?10/10/2020 109 > OR = 60 mL/min/1.48m Final  ? ?GFR, Est Non African American  ?Date Value Ref Range Status  ?10/10/2020 94 > OR = 60 mL/min/1.729mFinal  ? ?GFR  ?Date Value Ref Range Status  ?10/03/2011 94.25 >60.00 mL/min Final  ?   ?  ?  Passed - Patient is not pregnant  ?  ?  Passed - Valid encounter within last 12 months  ?  Recent Outpatient Visits   ?      ? 2 months ago Pre-diabetes  ? [place]  ? 7 months ago Pre-diabetes  ? [place]  ? 11 months ago Annual physical exam  ? So[place]DO  ? 1 year ago Pre-diabetes  ? [place]  ? 1 year ago Essential hypertension  ? [place]  ?  ?  ?Future Appointments   ?        ? In 3 weeks [facility] [facility]? In 3 months Ko[place]alene BatheMD [place]?  ? ?  ?  ?  ? ?

## 2021-10-16 ENCOUNTER — Other Ambulatory Visit: Payer: Self-pay | Admitting: Family Medicine

## 2021-10-16 ENCOUNTER — Ambulatory Visit: Payer: BC Managed Care – PPO | Admitting: Family Medicine

## 2021-10-16 ENCOUNTER — Encounter: Payer: Self-pay | Admitting: Family Medicine

## 2021-10-16 ENCOUNTER — Other Ambulatory Visit: Payer: BC Managed Care – PPO

## 2021-10-16 VITALS — BP 134/88 | HR 84 | Ht 69.0 in | Wt 279.4 lb

## 2021-10-16 DIAGNOSIS — Z125 Encounter for screening for malignant neoplasm of prostate: Secondary | ICD-10-CM

## 2021-10-16 DIAGNOSIS — R7303 Prediabetes: Secondary | ICD-10-CM

## 2021-10-16 DIAGNOSIS — Z0000 Encounter for general adult medical examination without abnormal findings: Secondary | ICD-10-CM

## 2021-10-16 DIAGNOSIS — I10 Essential (primary) hypertension: Secondary | ICD-10-CM

## 2021-10-16 DIAGNOSIS — M4802 Spinal stenosis, cervical region: Secondary | ICD-10-CM | POA: Diagnosis not present

## 2021-10-16 DIAGNOSIS — E782 Mixed hyperlipidemia: Secondary | ICD-10-CM

## 2021-10-16 LAB — POCT GLYCOSYLATED HEMOGLOBIN (HGB A1C): Hemoglobin A1C: 6.3 % — AB (ref 4.0–5.6)

## 2021-10-16 NOTE — Patient Instructions (Addendum)
Thank you for coming to the office today.  [facility] SHOULD have access to the MRI  Keep apt on 7/26 Dr Manuella Ghazi  We will contact their office to try to help get to an answer quicker. If possible - if not they will keep everything as scheduled, feel free to keep contacting them.  Recent Labs    02/20/21 1655 07/02/21 1512 10/16/21 1517  HGBA1C 6.1* 7.0* 6.3*   Call insurance find cost and coverage of the following - check the following: - Drug Tier, Preferred List, On Formulary - All will require a "Prior Authorization" from [place] first, before you can find out the cost - Find out if there is "Step Therapy" (other medicines required before you can try these)  Once you pick the one you want to try, let me know - we can get a sample ready IF we have it in stock. Then try it - and before running out of medicine, contact me back to order your Rx so we have time to get it processed.   For Weight Loss / Obesity only  Wegovy (same as Ozempic) weekly injection - start 0.'25mg'$  weekly, 1 dose per pen, single use, auto-injector  2. Saxenda - DAILY injection - start 0.'6mg'$  injection DAILY, sample is 3 weeks, new needle each dose.   3 benefits - 1 significantly reduced A1c sugar, and may be able to reduce or stop metformin in future - 2 reduced appetite and weight loss with good results - 3 cardiovascular risk reduction, less likely to have heart attack/stroke    DUE for FASTING BLOOD WORK (no food or drink after midnight before the lab appointment, only water or coffee without cream/sugar on the morning of)  SCHEDULE "Lab Only" visit in the morning at the clinic for lab draw in 3 MONTHS   - Make sure Lab Only appointment is at about 1 week before your next appointment, so that results will be available  For Lab Results, once available within 2-3 days of blood draw, you can can log in to MyChart online to view your results and a brief explanation. Also, we can discuss results at next  follow-up visit.   Please schedule a Follow-up Appointment to: Return in about 3 months (around 01/16/2022) for 3 month fasting lab only then 1 week later Annual Physical.  If you have any other questions or concerns, please feel free to call the office or send a message through [place]. You may also schedule an earlier appointment if necessary.  Additionally, you may be receiving a survey about your experience at our office within a few days to 1 week by e-mail or mail. We value your feedback.  [name], DO [place]

## 2021-10-16 NOTE — Progress Notes (Signed)
Subjective:    Patient ID: Michael Taylor, male    DOB: [birth year], 51 y.o.   MRN: [id number]  Michael Taylor is a 50 y.o. male presenting on 10/16/2021 for Prediabetes and Hypertension   HPI  CHRONIC HTN / Morbid Obesity BMI >41 Last visit 02/2021 improved on BP med See prior notes for background information. He is improving his compliance and adherence, weekly pill pack. Current Meds - Losartan '100mg'$  daily, Amlodipine '10mg'$  daily Adhering to medication better now Tolerating well, w/o complaints. Denies CP, dyspnea, HA, edema, dizziness / lightheadedness   PreDM  A1c prior elevated 6 range, highest 7 Last visit started Metformin 500 BID for PreDM Today due for A1c   Updates  Right Upper Extremity Paresthesia R hand dominant  Waiting on further management from [facility], MRI C-spine done at [facility] and see results below, he was advised may be from C6-C7, Neurology is going to review and assist in management, may warrant injection or procedure.  He will need to solve this first before he can proceed w/ [facility] Dr Harlow Mares on R Hip. Will need arthroplasty replacement.   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      10/16/2021    5:27 PM 10/16/2020    2:45 PM 12/28/2019    3:51 PM  Depression screen PHQ 2/9  Decreased Interest 0 0 0  Down, Depressed, Hopeless 0 0 0  PHQ - 2 Score 0 0 0  Altered sleeping  0   Tired, decreased energy  0   Change in appetite  0   Feeling bad or failure about yourself   0   Trouble concentrating  0   Moving slowly or fidgety/restless  0   Suicidal thoughts  0   PHQ-9 Score  0   Difficult doing work/chores  Not difficult at all     Social History   Tobacco Use   Smoking status: Never   Smokeless tobacco: Never  Vaping Use   Vaping Use: Never used  Substance Use Topics   Alcohol use: Yes     Alcohol/week: 4.0 standard drinks    Types: 4 Shots of liquor per week    Comment: rare, < 1 x monthly   Drug use: No    Review of Systems Per HPI unless specifically indicated above     Objective:    BP 134/88   Pulse 84   Ht '5\' 9"'$  (1.753 m)   Wt 279 lb 6.4 oz (126.7 kg)   SpO2 99%   BMI 41.26 kg/m   Wt Readings from Last 3 Encounters:  10/16/21 279 lb 6.4 oz (126.7 kg)  07/02/21 280 lb 12.8 oz (127.4 kg)  02/20/21 281 lb 9.6 oz (127.7 kg)    Physical Exam Vitals and nursing note reviewed.  Constitutional:      General: He is not in acute distress.    Appearance: He is well-developed. He is obese. He is not diaphoretic.     Comments: Well-appearing, comfortable, cooperative  HENT:     Head: Normocephalic and atraumatic.  Eyes:     General:        Right eye: No discharge.        Left eye: No discharge.     Conjunctiva/sclera: Conjunctivae normal.  Neck:     Thyroid: No thyromegaly.  Cardiovascular:     Rate and Rhythm: Normal rate  and regular rhythm.     Pulses: Normal pulses.     Heart sounds: Normal heart sounds. No murmur heard. Pulmonary:     Effort: Pulmonary effort is normal. No respiratory distress.     Breath sounds: Normal breath sounds. No wheezing or rales.  Musculoskeletal:        General: Normal range of motion.     Cervical back: Normal range of motion and neck supple.  Lymphadenopathy:     Cervical: No cervical adenopathy.  Skin:    General: Skin is warm and dry.     Findings: No erythema or rash.  Neurological:     Mental Status: He is alert and oriented to person, place, and time. Mental status is at baseline.  Psychiatric:        Behavior: Behavior normal.     Comments: Well groomed, good eye contact, normal speech and thoughts    I have personally reviewed the radiology report from 09/25/21 on C Spine MRI.   MRI Spine  Cervical WO IV Contrast  Anatomical Region Laterality Modality  C-spine -- Magnetic Resonance  T-spine -- --   Neck -- --   Impression  IMPRESSION:  1.  Degenerative disc disease and facet arthrosis as described above. This is most notable for moderate to severe spinal canal and severe RIGHT and moderate LEFT neuroforaminal stenosis at C6-C7.  2.  Moderate spinal canal and severe bilateral neuroforaminal stenosis at C5-C6.   Electronically Signed by: Bryon Lions on 09/25/2021 2:48 PM Narrative  INDICATION: Multifocal motor neuropathy.   COMPARISON: None.   TECHNIQUE: Multiplanar, multisequence MR imaging obtained through the cervical spine without intravenous administration of contrast on 09/25/2021 12:36 PM.   CONTRAST: None.   FINDINGS:  #  Motion artifact degrading some of the imaging.  #  Osseous structures: Anterior osteophytes at C4-C5, C5-C6 and C6-C7. Vertebral body heights are well-maintained. No evidence of an acute fracture.  #  Alignment: Alignment maintained.  #  Cervicomedullary junction: Unremarkable. No cerebellar tonsillar ectopia.  #  Spinal cord: No intrinsic spinal cord abnormality.   #  C2-C3: No significant disc herniation, spinal canal, or neuroforaminal compromise.  #  C3-C4: Facet arthrosis on the RIGHT. Posterior disc osteophytic complex and uncovertebral disease on the LEFT resulting in mild effacement of ventral thecal sac and mild LEFT neuroforaminal stenosis. RIGHT neuroforamen is patent.  #  C4-C5: Facet arthrosis with a posterior disc ossific complex. This results in mild effacement of the ventral thecal sac. Mild bilateral neuroforaminal stenosis.  #  C5-C6: Facet arthrosis with a posterior disc osteophytic complex and uncovertebral disease. This is producing moderate spinal canal narrowing and severe bilateral neuroforaminal stenosis.  #  C6-C7: Posterior RIGHT paracentral/foraminal disc protrusion producing moderate to severe spinal canal narrowing, more pronounced on the RIGHT. Severe RIGHT and moderate LEFT neural foraminal stenosis.  #  C7-T1: Uncovertebral  disease resulting in mild LEFT neuroforaminal stenosis. The RIGHT neuroforamen and spinal canal patent.   #  Paraspinal tissues: Unremarkable   #  Additional comments: None.  Results for orders placed or performed in visit on 10/16/21  POCT HgB A1C  Result Value Ref Range   Hemoglobin A1C 6.3 (A) 4.0 - 5.6 %      Assessment & Plan:   Problem List Items Addressed This Visit     Pre-diabetes - Primary    A1c 6.3 today On Metformin at risk of future T2DM/. Attributed to lifestyle poor choices - improving Concern with obesity, HTN, HLD  Plan:  1. Continue Metformin IR 500 BID for now - not long term therapy  2. Encourage improved lifestyle - low carb, low sugar diet, reduce portion size, continue improving regular exercise  Handout on GLP1 therapy, see below A&P        Relevant Orders   POCT HgB A1C (Completed)   Neuroforaminal stenosis of cervical spine   Morbid obesity ([place])   Essential hypertension    Well-controlled HTN No known complications    Plan:  1.  Continue current BP regimen Amlodipine '10mg'$  daily, Losartan '100mg'$  daily 2. Encourage improved lifestyle - low sodium diet, regular exercise 3. Continue monitor BP outside office, bring readings to next visit, if persistently >140/90 or new symptoms notify office sooner        No orders of the defined types were placed in this encounter.  Obesity Handout on AVS with GLP1 therapy options and discussed that his ins seems to cover [place]. Indicated for weight loss. This may be excellent option for weight loss / A1c control and future allow him to proceed with procedural intervention w/ hip surgery as well.  Neuroforaminal stenosis C Spine MRI reviewed He has upcoming [facility] apt 7/26 to review and proceed w/ management. We will put call out to them to make sure they review the MRI and if he can be seen sooner for management of this such as procedure / injection can proceed. He needs to resolve this prior to  upcoming Hip Surgery.     Follow up plan: Return in about 3 months (around 01/16/2022) for 3 month fasting lab only then 1 week later Annual Physical.  Future labs ordered 01/2022  [name], [facility] 10/16/2021, 3:06 PM

## 2021-10-16 NOTE — Assessment & Plan Note (Signed)
Well-controlled HTN No known complications    Plan:  1.  Continue current BP regimen Amlodipine '10mg'$  daily, Losartan '100mg'$  daily 2. Encourage improved lifestyle - low sodium diet, regular exercise 3. Continue monitor BP outside office, bring readings to next visit, if persistently >140/90 or new symptoms notify office sooner

## 2021-10-16 NOTE — Assessment & Plan Note (Signed)
A1c 6.3 today On Metformin at risk of future T2DM/. Attributed to lifestyle poor choices - improving Concern with obesity, HTN, HLD  Plan:  1. Continue Metformin IR 500 BID for now - not long term therapy  2. Encourage improved lifestyle - low carb, low sugar diet, reduce portion size, continue improving regular exercise  Handout on GLP1 therapy, see below A&P

## 2021-12-20 ENCOUNTER — Other Ambulatory Visit: Payer: Self-pay

## 2021-12-20 ENCOUNTER — Ambulatory Visit
Admission: RE | Admit: 2021-12-20 | Discharge: 2021-12-20 | Disposition: A | Discharge status: Home / Self Care | Payer: Self-pay | Source: Ambulatory Visit | Attending: Neurosurgery | Admitting: Neurosurgery

## 2021-12-20 DIAGNOSIS — Z049 Encounter for examination and observation for unspecified reason: Secondary | ICD-10-CM

## 2021-12-25 NOTE — H&P (View-Only) (Signed)
Referring Physician:  Vladimir Crofts, MD [facility] [place],  [place] [address]  Primary Physician:  Michael Hauser, DO  History of Present Illness: 12/25/2021 [name] is here today with a chief complaint of right triceps atrophy and numbness in his right hand.  He is also having neck pain at times.  He is having some trouble with buttons.  He has been having worsening weakness over the past 4 to 5 months.    Bowel/Bladder Dysfunction: none  Conservative measures:  Physical therapy: has not participated in formal PT; has done some home exercises Multimodal medical therapy including regular antiinflammatories:  baclofen, meloxicam, flexeril Injections:  has not received any epidural steroid injections  Past Surgery:  denies  [name] has some symptoms of cervical myelopathy.  The symptoms are causing a significant impact on the patient's life.   Review of Systems:  A 10 point review of systems is negative, except for the pertinent positives and negatives detailed in the HPI.  Past Medical History: Past Medical History:  Diagnosis Date   Dysplastic nevus 04/26/2015   Left sup. med. scapula. Moderate atypia, margins free.   Dysplastic nevus 04/26/2015   Low back spinal. Mild atypia, deep margin involved.   Dysplastic nevus 04/26/2015   Left posterior side. Mild atypia, lateral margin involved.   Dysplastic nevus 04/26/2015   Left lateral waistline. Mild atypia, lateral and deep margin involved.    Dysplastic nevus 04/27/2019   Right post. shoulder. Moderate atypia, close to margin   Dysplastic nevus 04/27/2019   Left low back paraspinal. Moderate atypia, peripheral margin involved.   Dysplastic nevus 04/27/2019   Right low back paraspinal above sacral. Moderate atypia, margins free.    Plantar fasciitis, bilateral    right greater that left,  diagnosed by Dr.  Claris Che    Past Surgical  History: Past Surgical History:  Procedure Laterality Date   HIP SURGERY     right,  [facility] bone graft   MECKEL DIVERTICULUM EXCISION  2011   [facility]    Allergies: Allergies as of 12/26/2021   (No Known Allergies)    Medications: No outpatient medications have been marked as taking for the 12/26/21 encounter (Appointment) with Meade Maw, MD.    Social History: Social History   Tobacco Use   Smoking status: Never   Smokeless tobacco: Never  Vaping Use   Vaping Use: Never used  Substance Use Topics   Alcohol use: Yes    Alcohol/week: 4.0 standard drinks of alcohol    Types: 4 Shots of liquor per week    Comment: rare, < 1 x monthly   Drug use: No    Family Medical History: Family History  Problem Relation Age of Onset   Arthritis Mother    Hypertension Mother    Arthritis Father        knee replacement   Hypertension Father    Heart disease Father 61   Heart attack Father 18   Prostate cancer Father 84       radiation treatment successful   Colon cancer Neg Hx     Physical Examination: There were no vitals filed for this visit.  General: Patient is well developed, well nourished, calm, collected, and in no apparent distress. Attention to examination is appropriate.  Neck:   Supple.  Full range of motion.  Respiratory: Patient is breathing without any  difficulty.   NEUROLOGICAL:     Awake, alert, oriented to person, place, and time.  Speech is clear and fluent. Fund of knowledge is appropriate.   Cranial Nerves: Pupils equal round and reactive to light.  Facial tone is symmetric.  Facial sensation is symmetric. Shoulder shrug is symmetric. Tongue protrusion is midline.  There is no pronator drift.  ROM of spine: full.    Strength: Side Biceps Triceps Deltoid Interossei Grip Wrist Ext. Wrist Flex.  R 4+ 4- '5 5 5 5 5  '$ L '5 5 5 5 5 5 5   '$ Side Iliopsoas Quads Hamstring PF DF EHL  R '5 5 5 5 5 5   '$ L '5 5 5 5 5 5   '$ Reflexes are 1+ and symmetric at the biceps, triceps, brachioradialis, patella and achilles.   Hoffman's is absent.  Clonus is not present.  Toes are down-going.  Bilateral upper and lower extremity sensation is intact to light touch.    No evidence of dysmetria noted.  Gait is slightly wide-based.    Medical Decision Making  Imaging: MRI C spine 09/25/21 #  C2-C3: No significant disc herniation, spinal canal, or neuroforaminal compromise.  #  C3-C4: Facet arthrosis on the RIGHT. Posterior disc osteophytic complex and uncovertebral disease on the LEFT resulting in mild effacement of ventral thecal sac and mild LEFT neuroforaminal stenosis. RIGHT neuroforamen is patent.  #  C4-C5: Facet arthrosis with a posterior disc ossific complex. This results in mild effacement of the ventral thecal sac. Mild bilateral neuroforaminal stenosis.  #  C5-C6: Facet arthrosis with a posterior disc osteophytic complex and uncovertebral disease. This is producing moderate spinal canal narrowing and severe bilateral neuroforaminal stenosis.  #  C6-C7: Posterior RIGHT paracentral/foraminal disc protrusion producing moderate to severe spinal canal narrowing, more pronounced on the RIGHT. Severe RIGHT and moderate LEFT neural foraminal stenosis.  #  C7-T1: Uncovertebral disease resulting in mild LEFT neuroforaminal stenosis. The RIGHT neuroforamen and spinal canal patent.  IMPRESSION:  1.  Degenerative disc disease and facet arthrosis as described above. This is most notable for moderate to severe spinal canal and severe RIGHT and moderate LEFT neuroforaminal stenosis at C6-C7.  2.  Moderate spinal canal and severe bilateral neuroforaminal stenosis at C5-C6.  I have personally reviewed the images and agree with the above interpretation.  Assessment and Plan: [name] is a pleasant 51 y.o. male with significant disc herniation C5-6 and C6-7 causing severe cervical stenosis and compression of the  right-sided C6 and C7 nerve roots.  He has weakness in his right triceps as well as muscle wasting due to this.  Given his profound muscle weakness and clinical symptoms of myelopathy, I think that further conservative management is not indicated.  I have recommended surgical intervention with C5-7 anterior cervical discectomy and fusion.  I discussed the planned procedure at length with the patient, including the risks, benefits, alternatives, and indications. The risks discussed include but are not limited to bleeding, infection, need for reoperation, spinal fluid leak, stroke, vision loss, anesthetic complication, coma, paralysis, and even death. We also discussed the possibility of post-operative dysphagia, vocal cord paralysis, and the risk of adjacent segment disease in the future. I also described in detail that improvement was not guaranteed.  The patient expressed understanding of these risks, and asked that we proceed with surgery. I described the surgery in layman's terms, and gave ample opportunity for questions, which were answered to the best of my ability.  He recently discontinued  his metformin due to some side effects.  I have asked him to reach out to his primary doctor regarding diabetes management strategies that may have less side effects.   I spent a total of' \\30'$  minutes in face-to-face and non-face-to-face activities related to this patient's care today.  Thank you for involving me in the care of this patient.      Kaneesha Constantino K. Izora Ribas MD, [facility]

## 2021-12-25 NOTE — Progress Notes (Signed)
Referring Physician:  Vladimir Crofts, MD [facility] [place],  [place] [address]  Primary Physician:  Michael Hauser, DO  History of Present Illness: 12/25/2021 Michael Taylor is here today with a chief complaint of right triceps atrophy and numbness in his right hand.  He is also having neck pain at times.  He is having some trouble with buttons.  He has been having worsening weakness over the past 4 to 5 months.    Bowel/Bladder Dysfunction: none  Conservative measures:  Physical therapy: has not participated in formal PT; has done some home exercises Multimodal medical therapy including regular antiinflammatories:  baclofen, meloxicam, flexeril Injections:  has not received any epidural steroid injections  Past Surgery:  denies  Michael Taylor has some symptoms of cervical myelopathy.  The symptoms are causing a significant impact on the patient's life.   Review of Systems:  A 10 point review of systems is negative, except for the pertinent positives and negatives detailed in the HPI.  Past Medical History: Past Medical History:  Diagnosis Date   Dysplastic nevus 04/26/2015   Left sup. med. scapula. Moderate atypia, margins free.   Dysplastic nevus 04/26/2015   Low back spinal. Mild atypia, deep margin involved.   Dysplastic nevus 04/26/2015   Left posterior side. Mild atypia, lateral margin involved.   Dysplastic nevus 04/26/2015   Left lateral waistline. Mild atypia, lateral and deep margin involved.    Dysplastic nevus 04/27/2019   Right post. shoulder. Moderate atypia, close to margin   Dysplastic nevus 04/27/2019   Left low back paraspinal. Moderate atypia, peripheral margin involved.   Dysplastic nevus 04/27/2019   Right low back paraspinal above sacral. Moderate atypia, margins free.    Plantar fasciitis, bilateral    right greater that left,  diagnosed by Dr.  Claris Taylor    Past Surgical  History: Past Surgical History:  Procedure Laterality Date   HIP SURGERY     right,  [facility] bone graft   MECKEL DIVERTICULUM EXCISION  2011   [facility]    Allergies: Allergies as of 12/26/2021   (No Known Allergies)    Medications: No outpatient medications have been marked as taking for the 12/26/21 encounter (Appointment) with Michael Maw, MD.    Social History: Social History   Tobacco Use   Smoking status: Never   Smokeless tobacco: Never  Vaping Use   Vaping Use: Never used  Substance Use Topics   Alcohol use: Yes    Alcohol/week: 4.0 standard drinks of alcohol    Types: 4 Shots of liquor per week    Comment: rare, < 1 x monthly   Drug use: No    Family Medical History: Family History  Problem Relation Age of Onset   Arthritis Mother    Hypertension Mother    Arthritis Father        knee replacement   Hypertension Father    Heart disease Father 59   Heart attack Father 105   Prostate cancer Father 58       radiation treatment successful   Colon cancer Neg Hx     Physical Examination: There were no vitals filed for this visit.  General: Patient is well developed, well nourished, calm, collected, and in no apparent distress. Attention to examination is appropriate.  Neck:   Supple.  Full range of motion.  Respiratory: Patient is breathing without any  difficulty.   NEUROLOGICAL:     Awake, alert, oriented to person, place, and time.  Speech is clear and fluent. Fund of knowledge is appropriate.   Cranial Nerves: Pupils equal round and reactive to light.  Facial tone is symmetric.  Facial sensation is symmetric. Shoulder shrug is symmetric. Tongue protrusion is midline.  There is no pronator drift.  ROM of spine: full.    Strength: Side Biceps Triceps Deltoid Interossei Grip Wrist Ext. Wrist Flex.  R 4+ 4- '5 5 5 5 5  '$ L '5 5 5 5 5 5 5   '$ Side Iliopsoas Quads Hamstring PF DF EHL  R '5 5 5 5 5 5   '$ L '5 5 5 5 5 5   '$ Reflexes are 1+ and symmetric at the biceps, triceps, brachioradialis, patella and achilles.   Hoffman's is absent.  Clonus is not present.  Toes are down-going.  Bilateral upper and lower extremity sensation is intact to light touch.    No evidence of dysmetria noted.  Gait is slightly wide-based.    Medical Decision Making  Imaging: MRI C spine 09/25/21 #  C2-C3: No significant disc herniation, spinal canal, or neuroforaminal compromise.  #  C3-C4: Facet arthrosis on the RIGHT. Posterior disc osteophytic complex and uncovertebral disease on the LEFT resulting in mild effacement of ventral thecal sac and mild LEFT neuroforaminal stenosis. RIGHT neuroforamen is patent.  #  C4-C5: Facet arthrosis with a posterior disc ossific complex. This results in mild effacement of the ventral thecal sac. Mild bilateral neuroforaminal stenosis.  #  C5-C6: Facet arthrosis with a posterior disc osteophytic complex and uncovertebral disease. This is producing moderate spinal canal narrowing and severe bilateral neuroforaminal stenosis.  #  C6-C7: Posterior RIGHT paracentral/foraminal disc protrusion producing moderate to severe spinal canal narrowing, more pronounced on the RIGHT. Severe RIGHT and moderate LEFT neural foraminal stenosis.  #  C7-T1: Uncovertebral disease resulting in mild LEFT neuroforaminal stenosis. The RIGHT neuroforamen and spinal canal patent.  IMPRESSION:  1.  Degenerative disc disease and facet arthrosis as described above. This is most notable for moderate to severe spinal canal and severe RIGHT and moderate LEFT neuroforaminal stenosis at C6-C7.  2.  Moderate spinal canal and severe bilateral neuroforaminal stenosis at C5-C6.  I have personally reviewed the images and agree with the above interpretation.  Assessment and Plan: [name] is a pleasant 51 y.o. male with significant disc herniation C5-6 and C6-7 causing severe cervical stenosis and compression of the  right-sided C6 and C7 nerve roots.  He has weakness in his right triceps as well as muscle wasting due to this.  Given his profound muscle weakness and clinical symptoms of myelopathy, I think that further conservative management is not indicated.  I have recommended surgical intervention with C5-7 anterior cervical discectomy and fusion.  I discussed the planned procedure at length with the patient, including the risks, benefits, alternatives, and indications. The risks discussed include but are not limited to bleeding, infection, need for reoperation, spinal fluid leak, stroke, vision loss, anesthetic complication, coma, paralysis, and even death. We also discussed the possibility of post-operative dysphagia, vocal cord paralysis, and the risk of adjacent segment disease in the future. I also described in detail that improvement was not guaranteed.  The patient expressed understanding of these risks, and asked that we proceed with surgery. I described the surgery in layman's terms, and gave ample opportunity for questions, which were answered to the best of my ability.  He recently discontinued  his metformin due to some side effects.  I have asked him to reach out to his primary doctor regarding diabetes management strategies that may have less side effects.   I spent a total of' \\30'$  minutes in face-to-face and non-face-to-face activities related to this patient's care today.  Thank you for involving me in the care of this patient.      Daliyah Sramek K. Izora Ribas MD, [facility]

## 2021-12-26 ENCOUNTER — Telehealth: Payer: Self-pay | Admitting: Family Medicine

## 2021-12-26 ENCOUNTER — Encounter: Payer: Self-pay | Admitting: Neurosurgery

## 2021-12-26 ENCOUNTER — Ambulatory Visit (INDEPENDENT_AMBULATORY_CARE_PROVIDER_SITE_OTHER): Payer: BC Managed Care – PPO | Admitting: Neurosurgery

## 2021-12-26 VITALS — BP 169/97 | HR 86 | Ht 69.0 in | Wt 276.0 lb

## 2021-12-26 DIAGNOSIS — M5412 Radiculopathy, cervical region: Secondary | ICD-10-CM

## 2021-12-26 DIAGNOSIS — G959 Disease of spinal cord, unspecified: Secondary | ICD-10-CM | POA: Diagnosis not present

## 2021-12-26 DIAGNOSIS — M4802 Spinal stenosis, cervical region: Secondary | ICD-10-CM | POA: Diagnosis not present

## 2021-12-26 DIAGNOSIS — R29898 Other symptoms and signs involving the musculoskeletal system: Secondary | ICD-10-CM | POA: Diagnosis not present

## 2021-12-26 DIAGNOSIS — R7303 Prediabetes: Secondary | ICD-10-CM

## 2021-12-26 MED ORDER — METFORMIN HCL ER 500 MG PO TB24: 500.0000 mg | ORAL_TABLET | Freq: Every day | ORAL | 1 refills | Status: DC

## 2021-12-26 NOTE — Telephone Encounter (Signed)
Received notification from Dr Izora Ribas on patients upcoming cervical spine procedure.  His A1c has been in low 6 range, and is acceptable for surgery but he came off Metformin IR due to GI intolerance.  I advised limited options as Pre Diabetic.  I will DC Metformin IR 500 BID and switch to Metformin XR 500 daily, hold day of surgery  [name], [facility] 12/26/2021, 2:04 PM

## 2021-12-26 NOTE — Patient Instructions (Signed)
Please see below for information in regards to your upcoming surgery:  Planned surgery: C5-7 anterior cervical discectomy and fusion   Surgery date: 01/15/22 - you will find out your arrival time the business day before your surgery.   NSAIDS (Non-steroidal anti-inflammatory drugs): because you are having a fusion, no NSAIDS (such as ibuprofen, aleve, naproxen, meloxicam, diclofenac) for 3 months after surgery. Celebrex is an exception. Tylenol is ok because it is not an NSAID.   Surgical clearance: we will send a clearance request to Dr [place] Ranger   Pre-op appointment at [place]: we will call you with a date/time for this. Pre-admit testing is located on the first floor of the [facility], [place], [address].   Pre-op labs may be done at your pre-op appointment. You are not required to fast for these labs.    Should you need to change your pre-op appointment, please call Pre-admit testing at [phone].     Because you are having a fusion: for appointments after your 2 week follow-up: please arrive at the [facility] outpatient imaging center ([place], [place]) or [facility] one hour prior to your appointment for x-rays. This applies to every appointment after your 2 week follow-up. Failure to do so may result in your appointment being rescheduled.   If you have FMLA/disability paperwork, please drop it off or fax it to [phone], attention [place].   If you have any questions/concerns before or after surgery, you can reach [place] at [phone], or you can send a mychart message. If you have a concern after hours that cannot wait until normal business hours, you can call [phone] or [phone] and ask the answering service to page the neurosurgeon on call.    Appointments/FMLA & disability paperwork: [place] Nurse: Ophelia Shoulder  Medical assistant: Raquel Sarna Physician Assistant's:  [place] Surgeon: Meade Maw, MD    GLP-1 antagonists taken weekly be held for 7days prior to surgery.  GLP-1 antagonists taken daily be held the day of surgery  These include:  Dulaglutide (Trulicity) Exenatide extended release (Bydureon BCise) Exenatide (Byetta) Semaglutide (Ozempic; Reagen.Frieze) Tirzepatide (Mounjaro) Liraglutide (Victoza, Saxenda) Lixisenatide (Adlyxin)    Semaglutide (Rybelsus) is oral and should be held for 3 days    Metformin - should be held for 2 days prior to surgery    SGLT2 inhibitors should be held for 3 days (not longer because of the risk of euglycemic diabetic ketoacidosis) and include:  Canagliflozin (Invokana) Ertugliflozin (Steglatro) Dapagliflozin (Farxiga) Empagliflozin (Jardiance)

## 2021-12-27 ENCOUNTER — Other Ambulatory Visit: Payer: Self-pay

## 2021-12-27 DIAGNOSIS — Z01818 Encounter for other preprocedural examination: Secondary | ICD-10-CM

## 2021-12-29 ENCOUNTER — Other Ambulatory Visit: Payer: Self-pay | Admitting: Family Medicine

## 2021-12-29 DIAGNOSIS — M722 Plantar fascial fibromatosis: Secondary | ICD-10-CM

## 2021-12-31 NOTE — Telephone Encounter (Signed)
Requested medication (s) are due for refill today: yes  Requested medication (s) are on the active medication list: yes  Last refill:  09/25/21  Future visit scheduled: yes  Notes to clinic:  Unable to refill per protocol due to failed labs, no updated results. Routing for review.     Requested Prescriptions  Pending Prescriptions Disp Refills   meloxicam (MOBIC) 15 MG tablet [Pharmacy Med Name: MELOXICAM 15 MG TABLET] 90 tablet 0    Sig: TAKE 1 TABLET BY MOUTH EVERY DAY AS NEEDED FOR PAIN     Analgesics:  COX2 Inhibitors Failed - 12/29/2021  9:39 AM      Failed - Manual Review: Labs are only required if the patient has taken medication for more than 8 weeks.      Failed - HGB in normal range and within 360 days    Hemoglobin  Date Value Ref Range Status  10/10/2020 15.4 13.2 - 17.1 g/dL Final         Failed - Cr in normal range and within 360 days    Creat  Date Value Ref Range Status  10/10/2020 0.93 0.70 - 1.33 mg/dL Final    Comment:    For patients >79 years of age, the reference limit for Creatinine is approximately 13% higher for people identified as African-American. .          Failed - HCT in normal range and within 360 days    HCT  Date Value Ref Range Status  10/10/2020 48.3 38.5 - 50.0 % Final         Failed - AST in normal range and within 360 days    AST  Date Value Ref Range Status  10/10/2020 28 10 - 35 U/L Final         Failed - ALT in normal range and within 360 days    ALT  Date Value Ref Range Status  10/10/2020 33 9 - 46 U/L Final         Failed - eGFR is 30 or above and within 360 days    GFR, Est African American  Date Value Ref Range Status  10/10/2020 109 > OR = 60 mL/min/1.50m Final   GFR, Est Non African American  Date Value Ref Range Status  10/10/2020 94 > OR = 60 mL/min/1.775mFinal   GFR  Date Value Ref Range Status  10/03/2011 94.25 >60.00 mL/min Final         Passed - Patient is not pregnant      Passed - Valid  encounter within last 12 months    Recent Outpatient Visits           2 months ago [facility]-di[facility]   6 months ago [facility]-di[facility]   10 months ago Pre-diabetes   [facility]   1 year ago Annual physical exam   [facility]   [facility]       Future Appointments             In 1 week [place] [place] In 3 weeks [facility] [facility]oughty[facility]

## 2022-01-06 ENCOUNTER — Encounter
Admission: RE | Admit: 2022-01-06 | Discharge: 2022-01-06 | Disposition: A | Discharge status: Home / Self Care | Payer: BC Managed Care – PPO | Source: Ambulatory Visit | Attending: Neurosurgery | Admitting: Neurosurgery

## 2022-01-06 ENCOUNTER — Other Ambulatory Visit: Payer: Self-pay

## 2022-01-06 VITALS — HR 86 | Resp 16

## 2022-01-06 DIAGNOSIS — Z01818 Encounter for other preprocedural examination: Secondary | ICD-10-CM | POA: Diagnosis present

## 2022-01-06 DIAGNOSIS — Z01812 Encounter for preprocedural laboratory examination: Secondary | ICD-10-CM

## 2022-01-06 HISTORY — DX: Myoneural disorder, unspecified: G70.9

## 2022-01-06 HISTORY — DX: Prediabetes: R73.03

## 2022-01-06 HISTORY — DX: Unspecified osteoarthritis, unspecified site: M19.90

## 2022-01-06 LAB — CBC
HCT: 42 % (ref 39.0–52.0)
Hemoglobin: 14 g/dL (ref 13.0–17.0)
MCH: 26.6 pg (ref 26.0–34.0)
MCHC: 33.3 g/dL (ref 30.0–36.0)
MCV: 79.8 fL — ABNORMAL LOW (ref 80.0–100.0)
Platelets: 223 10*3/uL (ref 150–400)
RBC: 5.26 MIL/uL (ref 4.22–5.81)
RDW: 14 % (ref 11.5–15.5)
WBC: 6.6 10*3/uL (ref 4.0–10.5)
nRBC: 0 % (ref 0.0–0.2)

## 2022-01-06 LAB — BASIC METABOLIC PANEL
Anion gap: 8 (ref 5–15)
BUN: 12 mg/dL (ref 6–20)
CO2: 29 mmol/L (ref 22–32)
Calcium: 9 mg/dL (ref 8.9–10.3)
Chloride: 103 mmol/L (ref 98–111)
Creatinine, Ser: 0.83 mg/dL (ref 0.61–1.24)
GFR, Estimated: >60 mL/min (ref >60)
Glucose, Bld: 135 mg/dL — ABNORMAL HIGH (ref 70–99)
Potassium: 3.9 mmol/L (ref 3.5–5.1)
Sodium: 140 mmol/L (ref 135–145)

## 2022-01-06 LAB — TYPE AND SCREEN
ABO/RH(D): B POS
Antibody Screen: NEGATIVE

## 2022-01-06 LAB — SURGICAL PCR SCREEN
MRSA, PCR: NEGATIVE
Staphylococcus aureus: POSITIVE — AB

## 2022-01-06 MED ORDER — FAMOTIDINE 20 MG PO TABS: 20.0000 mg | ORAL_TABLET | Freq: Once | ORAL | Status: DC

## 2022-01-06 NOTE — Patient Instructions (Addendum)
Your procedure is scheduled on: 01/15/22 -  Wednesday Report to the Registration Desk on the 1st floor of the [facility]. To find out your arrival time, please call [phone] between 1PM - 3PM on: 01/14/22 - Tuesday If your arrival time is 6:00 am, do not arrive prior to that time as the [facility] entrance doors do not open until 6:00 am.  REMEMBER: Instructions that are not followed completely may result in serious medical risk, up to and including death; or upon the discretion of your surgeon and anesthesiologist your surgery may need to be rescheduled.  Do not eat food after midnight the night before surgery.  No gum chewing, lozengers or hard candies.  You may however, drink CLEAR liquids up to 2 hours before you are scheduled to arrive for your surgery. Do not drink anything within 2 hours of your scheduled arrival time.  Clear liquids include: - water  - apple juice without pulp - gatorade (not RED colors) - black coffee or tea (Do NOT add milk or creamers to the coffee or tea) Do NOT drink anything that is not on this list.  TAKE THESE MEDICATIONS THE MORNING OF SURGERY WITH A SIP OF WATER:  - amLODipine (NORVASC)  - omeprazole (PRILOSEC), (take one the night before and one on the morning of surgery - helps to prevent nausea after surgery.)  Stop taking metFORMIN (GLUCOPHAGE-XR) beginning 01/13/22, may resume the day after your surgery.  Hold losartan (COZAAR) on the day of surgery, may resume the day after your surgery.  Stop taking beginning 01/08/22, One week prior to surgery: meloxicam (MOBIC) 15 MG tablet Stop Anti-inflammatories (NSAIDS) such as Advil, Aleve, Ibuprofen, Motrin, Naproxen, Naprosyn and Aspirin based products such as Excedrin, Goodys Powder, BC Powder.  Stop ANY OVER THE COUNTER supplements until after surgery.  You may however, continue to take Tylenol if needed for pain up until the day of surgery.  No Alcohol for 24 hours before or after  surgery.  No Smoking including e-cigarettes for 24 hours prior to surgery.  No chewable tobacco products for at least 6 hours prior to surgery.  No nicotine patches on the day of surgery.  Do not use any "recreational" drugs for at least a week prior to your surgery.  Please be advised that the combination of cocaine and anesthesia may have negative outcomes, up to and including death. If you test positive for cocaine, your surgery will be cancelled.  On the morning of surgery brush your teeth with toothpaste and water, you may rinse your mouth with mouthwash if you wish. Do not swallow any toothpaste or mouthwash.  Use CHG Soap or wipes as directed on instruction sheet.  Do not wear jewelry, make-up, hairpins, clips or nail polish.  Do not wear lotions, powders, or perfumes.   Do not shave body from the neck down 48 hours prior to surgery just in case you cut yourself which could leave a site for infection.  Also, freshly shaved skin may become irritated if using the CHG soap.  Contact lenses, hearing aids and dentures may not be worn into surgery.  Do not bring valuables to the hospital. [facility] is not responsible for any missing/lost belongings or valuables.   Notify your doctor if there is any change in your medical condition (cold, fever, infection).  Wear comfortable clothing (specific to your surgery type) to the hospital.  After surgery, you can help prevent lung complications by doing breathing exercises.  Take deep breaths  and cough every 1-2 hours. Your doctor may order a device called an Incentive Spirometer to help you take deep breaths. When coughing or sneezing, hold a pillow firmly against your incision with both hands. This is called "splinting." Doing this helps protect your incision. It also decreases belly discomfort.  If you are being admitted to the hospital overnight, leave your suitcase in the car. After surgery it may be brought to your room.  If you  are being discharged the day of surgery, you will not be allowed to drive home. You will need a responsible adult (18 years or older) to drive you home and stay with you that night.   If you are taking public transportation, you will need to have a responsible adult (18 years or older) with you. Please confirm with your physician that it is acceptable to use public transportation.   Please call the [place] Dept. at [phone] if you have any questions about these instructions.  Surgery Visitation Policy:  Patients undergoing a surgery or procedure may have two family members or support persons with them as long as the person is not COVID-19 positive or experiencing its symptoms.   Inpatient Visitation:    Visiting hours are 7 a.m. to 8 p.m. Up to four visitors are allowed at one time in a patient room, including children. The visitors may rotate out with other people during the day. One designated support person (adult) may remain overnight.

## 2022-01-08 ENCOUNTER — Ambulatory Visit: Payer: BC Managed Care – PPO | Admitting: Dermatology

## 2022-01-14 ENCOUNTER — Other Ambulatory Visit: Payer: BC Managed Care – PPO

## 2022-01-15 ENCOUNTER — Ambulatory Visit: Payer: BC Managed Care – PPO

## 2022-01-15 ENCOUNTER — Ambulatory Visit: Payer: BC Managed Care – PPO | Admitting: Urgent Care

## 2022-01-15 ENCOUNTER — Other Ambulatory Visit: Payer: Self-pay

## 2022-01-15 ENCOUNTER — Encounter
Admission: RE | Disposition: A | Discharge status: Home / Self Care | Payer: Self-pay | Source: Home / Self Care | Attending: Neurosurgery

## 2022-01-15 ENCOUNTER — Ambulatory Visit: Payer: BC Managed Care – PPO | Admitting: Certified Registered"

## 2022-01-15 ENCOUNTER — Other Ambulatory Visit: Payer: Self-pay | Admitting: Neurosurgery

## 2022-01-15 ENCOUNTER — Encounter: Payer: Self-pay | Admitting: Neurosurgery

## 2022-01-15 ENCOUNTER — Ambulatory Visit
Admission: RE | Admit: 2022-01-15 | Discharge: 2022-01-15 | Disposition: A | Discharge status: Home / Self Care | Payer: BC Managed Care – PPO | Attending: Neurosurgery | Admitting: Neurosurgery

## 2022-01-15 DIAGNOSIS — K219 Gastro-esophageal reflux disease without esophagitis: Secondary | ICD-10-CM | POA: Insufficient documentation

## 2022-01-15 DIAGNOSIS — Z01818 Encounter for other preprocedural examination: Secondary | ICD-10-CM

## 2022-01-15 DIAGNOSIS — M5412 Radiculopathy, cervical region: Secondary | ICD-10-CM | POA: Diagnosis not present

## 2022-01-15 DIAGNOSIS — M62521 Muscle wasting and atrophy, not elsewhere classified, right upper arm: Secondary | ICD-10-CM | POA: Insufficient documentation

## 2022-01-15 DIAGNOSIS — M50222 Other cervical disc displacement at C5-C6 level: Secondary | ICD-10-CM | POA: Insufficient documentation

## 2022-01-15 DIAGNOSIS — R531 Weakness: Secondary | ICD-10-CM | POA: Insufficient documentation

## 2022-01-15 DIAGNOSIS — G992 Myelopathy in diseases classified elsewhere: Secondary | ICD-10-CM | POA: Diagnosis not present

## 2022-01-15 DIAGNOSIS — R202 Paresthesia of skin: Secondary | ICD-10-CM

## 2022-01-15 DIAGNOSIS — R29898 Other symptoms and signs involving the musculoskeletal system: Secondary | ICD-10-CM | POA: Diagnosis not present

## 2022-01-15 DIAGNOSIS — M50223 Other cervical disc displacement at C6-C7 level: Secondary | ICD-10-CM | POA: Diagnosis not present

## 2022-01-15 DIAGNOSIS — I10 Essential (primary) hypertension: Secondary | ICD-10-CM | POA: Insufficient documentation

## 2022-01-15 DIAGNOSIS — G959 Disease of spinal cord, unspecified: Secondary | ICD-10-CM | POA: Diagnosis not present

## 2022-01-15 DIAGNOSIS — G5711 Meralgia paresthetica, right lower limb: Secondary | ICD-10-CM

## 2022-01-15 DIAGNOSIS — M4802 Spinal stenosis, cervical region: Secondary | ICD-10-CM | POA: Diagnosis not present

## 2022-01-15 DIAGNOSIS — Z6841 Body Mass Index (BMI) 40.0 and over, adult: Secondary | ICD-10-CM | POA: Insufficient documentation

## 2022-01-15 DIAGNOSIS — M159 Polyosteoarthritis, unspecified: Secondary | ICD-10-CM

## 2022-01-15 HISTORY — PX: ANTERIOR CERVICAL DECOMP/DISCECTOMY FUSION: SHX1161

## 2022-01-15 LAB — GLUCOSE, CAPILLARY
Glucose-Capillary: 156 mg/dL — ABNORMAL HIGH (ref 70–99)
Glucose-Capillary: 176 mg/dL — ABNORMAL HIGH (ref 70–99)

## 2022-01-15 LAB — ABO/RH: ABO/RH(D): B POS

## 2022-01-15 SURGERY — ANTERIOR CERVICAL DECOMPRESSION/DISCECTOMY FUSION 2 LEVELS
Anesthesia: General | Site: Spine Cervical

## 2022-01-15 MED ORDER — SODIUM CHLORIDE 0.9 % IV SOLN: INTRAVENOUS | Status: DC | PRN

## 2022-01-15 MED ORDER — PROPOFOL 1000 MG/100ML IV EMUL
INTRAVENOUS | Status: AC
  Filled 2022-01-15: qty 100

## 2022-01-15 MED ORDER — PROPOFOL 10 MG/ML IV BOLUS
INTRAVENOUS | Status: AC
  Filled 2022-01-15: qty 20

## 2022-01-15 MED ORDER — CELECOXIB 200 MG PO CAPS: 200.0000 mg | ORAL_CAPSULE | Freq: Two times a day (BID) | ORAL | 1 refills | Status: DC

## 2022-01-15 MED ORDER — SODIUM CHLORIDE (PF) 0.9 % IJ SOLN
INTRAMUSCULAR | Status: AC
  Filled 2022-01-15: qty 20

## 2022-01-15 MED ORDER — GLYCOPYRROLATE 0.2 MG/ML IJ SOLN
INTRAMUSCULAR | Status: AC
  Filled 2022-01-15: qty 1

## 2022-01-15 MED ORDER — SURGIFLO WITH THROMBIN (HEMOSTATIC MATRIX KIT) OPTIME
TOPICAL | Status: DC | PRN
  Administered 2022-01-15: 1 via TOPICAL

## 2022-01-15 MED ORDER — MIDAZOLAM HCL 2 MG/2ML IJ SOLN
INTRAMUSCULAR | Status: DC | PRN
  Administered 2022-01-15: 2 mg via INTRAVENOUS

## 2022-01-15 MED ORDER — ONDANSETRON HCL 4 MG/2ML IJ SOLN: 4.0000 mg | Freq: Once | INTRAMUSCULAR | Status: DC | PRN

## 2022-01-15 MED ORDER — PROPOFOL 10 MG/ML IV BOLUS
INTRAVENOUS | Status: DC | PRN
  Administered 2022-01-15: 120 ug/kg/min via INTRAVENOUS
  Administered 2022-01-15: 180 mg via INTRAVENOUS

## 2022-01-15 MED ORDER — PHENYLEPHRINE HCL-NACL 20-0.9 MG/250ML-% IV SOLN
INTRAVENOUS | Status: DC | PRN
  Administered 2022-01-15: 80 ug/min via INTRAVENOUS

## 2022-01-15 MED ORDER — BUPIVACAINE-EPINEPHRINE (PF) 0.5% -1:200000 IJ SOLN
INTRAMUSCULAR | Status: DC | PRN
  Administered 2022-01-15: 6 mL

## 2022-01-15 MED ORDER — ONDANSETRON HCL 4 MG/2ML IJ SOLN
INTRAMUSCULAR | Status: DC | PRN
  Administered 2022-01-15: 4 mg via INTRAVENOUS

## 2022-01-15 MED ORDER — PHENYLEPHRINE HCL (PRESSORS) 10 MG/ML IV SOLN
INTRAVENOUS | Status: DC | PRN
  Administered 2022-01-15 (×4): 80 ug via INTRAVENOUS
  Administered 2022-01-15: 160 ug via INTRAVENOUS
  Administered 2022-01-15: 80 ug via INTRAVENOUS

## 2022-01-15 MED ORDER — MIDAZOLAM HCL 2 MG/2ML IJ SOLN
INTRAMUSCULAR | Status: AC
  Filled 2022-01-15: qty 2

## 2022-01-15 MED ORDER — GLYCOPYRROLATE 0.2 MG/ML IJ SOLN
INTRAMUSCULAR | Status: DC | PRN
  Administered 2022-01-15: .2 mg via INTRAVENOUS

## 2022-01-15 MED ORDER — CHLORHEXIDINE GLUCONATE 0.12 % MT SOLN
OROMUCOSAL | Status: AC
  Administered 2022-01-15: 15 mL via OROMUCOSAL
  Filled 2022-01-15: qty 15

## 2022-01-15 MED ORDER — ACETAMINOPHEN 10 MG/ML IV SOLN
INTRAVENOUS | Status: DC | PRN
  Administered 2022-01-15: 1000 mg via INTRAVENOUS

## 2022-01-15 MED ORDER — LACTATED RINGERS IV SOLN: INTRAVENOUS | Status: DC

## 2022-01-15 MED ORDER — PHENYLEPHRINE HCL-NACL 20-0.9 MG/250ML-% IV SOLN
INTRAVENOUS | Status: AC
  Filled 2022-01-15: qty 250

## 2022-01-15 MED ORDER — EPHEDRINE SULFATE (PRESSORS) 50 MG/ML IJ SOLN
INTRAMUSCULAR | Status: DC | PRN
  Administered 2022-01-15 (×2): 10 mg via INTRAVENOUS

## 2022-01-15 MED ORDER — BACLOFEN 10 MG PO TABS: 5.0000 mg | ORAL_TABLET | Freq: Three times a day (TID) | ORAL | 2 refills | Status: DC | PRN

## 2022-01-15 MED ORDER — REMIFENTANIL HCL 1 MG IV SOLR
INTRAVENOUS | Status: AC
  Filled 2022-01-15: qty 1000

## 2022-01-15 MED ORDER — ONDANSETRON HCL 4 MG/2ML IJ SOLN
INTRAMUSCULAR | Status: AC
  Filled 2022-01-15: qty 2

## 2022-01-15 MED ORDER — DEXAMETHASONE SODIUM PHOSPHATE 10 MG/ML IJ SOLN
INTRAMUSCULAR | Status: DC | PRN
  Administered 2022-01-15: 10 mg via INTRAVENOUS

## 2022-01-15 MED ORDER — SUCCINYLCHOLINE CHLORIDE 200 MG/10ML IV SOSY
PREFILLED_SYRINGE | INTRAVENOUS | Status: DC | PRN
  Administered 2022-01-15: 160 mg via INTRAVENOUS

## 2022-01-15 MED ORDER — SODIUM CHLORIDE 0.9 % IV SOLN: INTRAVENOUS | Status: DC

## 2022-01-15 MED ORDER — FENTANYL CITRATE (PF) 100 MCG/2ML IJ SOLN: 25.0000 ug | INTRAMUSCULAR | Status: DC | PRN

## 2022-01-15 MED ORDER — FENTANYL CITRATE (PF) 100 MCG/2ML IJ SOLN
INTRAMUSCULAR | Status: DC | PRN
  Administered 2022-01-15 (×2): 50 ug via INTRAVENOUS

## 2022-01-15 MED ORDER — CEFAZOLIN SODIUM-DEXTROSE 2-4 GM/100ML-% IV SOLN
2.0000 g | Freq: Once | INTRAVENOUS | Status: AC
  Administered 2022-01-15: 2 g via INTRAVENOUS

## 2022-01-15 MED ORDER — OXYCODONE HCL 5 MG PO TABS
ORAL_TABLET | ORAL | Status: AC
  Administered 2022-01-15: 5 mg
  Filled 2022-01-15: qty 1

## 2022-01-15 MED ORDER — CHLORHEXIDINE GLUCONATE 0.12 % MT SOLN
15.0000 mL | Freq: Once | OROMUCOSAL | Status: AC
Start: 2022-01-15 — End: 2022-01-15

## 2022-01-15 MED ORDER — ORAL CARE MOUTH RINSE
15.0000 mL | Freq: Once | OROMUCOSAL | Status: AC
Start: 2022-01-15 — End: 2022-01-15

## 2022-01-15 MED ORDER — OXYCODONE HCL 5 MG PO TABS: 5.0000 mg | ORAL_TABLET | ORAL | 0 refills | Status: AC | PRN

## 2022-01-15 MED ORDER — REMIFENTANIL HCL 1 MG IV SOLR
INTRAVENOUS | Status: DC | PRN
  Administered 2022-01-15: .05 ug/kg/min via INTRAVENOUS

## 2022-01-15 MED ORDER — SUCCINYLCHOLINE CHLORIDE 200 MG/10ML IV SOSY
PREFILLED_SYRINGE | INTRAVENOUS | Status: AC
  Filled 2022-01-15: qty 10

## 2022-01-15 MED ORDER — LIDOCAINE HCL (PF) 2 % IJ SOLN
INTRAMUSCULAR | Status: AC
  Filled 2022-01-15: qty 5

## 2022-01-15 MED ORDER — BUPIVACAINE-EPINEPHRINE (PF) 0.5% -1:200000 IJ SOLN
INTRAMUSCULAR | Status: AC
  Filled 2022-01-15: qty 30

## 2022-01-15 MED ORDER — LIDOCAINE HCL (CARDIAC) PF 100 MG/5ML IV SOSY
PREFILLED_SYRINGE | INTRAVENOUS | Status: DC | PRN
  Administered 2022-01-15: 100 mg via INTRAVENOUS

## 2022-01-15 MED ORDER — 0.9 % SODIUM CHLORIDE (POUR BTL) OPTIME
TOPICAL | Status: DC | PRN
  Administered 2022-01-15: 500 mL

## 2022-01-15 MED ORDER — DEXAMETHASONE SODIUM PHOSPHATE 10 MG/ML IJ SOLN
INTRAMUSCULAR | Status: AC
  Filled 2022-01-15: qty 1

## 2022-01-15 MED ORDER — CEFAZOLIN SODIUM-DEXTROSE 2-4 GM/100ML-% IV SOLN
INTRAVENOUS | Status: AC
  Filled 2022-01-15: qty 100

## 2022-01-15 MED ORDER — EPHEDRINE 5 MG/ML INJ
INTRAVENOUS | Status: AC
  Filled 2022-01-15: qty 5

## 2022-01-15 MED ORDER — ACETAMINOPHEN 10 MG/ML IV SOLN
INTRAVENOUS | Status: AC
Start: 2022-01-15 — End: ?
  Filled 2022-01-15: qty 100

## 2022-01-15 MED ORDER — FENTANYL CITRATE (PF) 100 MCG/2ML IJ SOLN
INTRAMUSCULAR | Status: AC
  Filled 2022-01-15: qty 2

## 2022-01-15 MED ORDER — PROPOFOL 1000 MG/100ML IV EMUL
INTRAVENOUS | Status: AC
Start: 2022-01-15 — End: ?
  Filled 2022-01-15: qty 100

## 2022-01-15 SURGICAL SUPPLY — 52 items
ADH SKN CLS APL DERMABOND .7 (GAUZE/BANDAGES/DRESSINGS) ×1
AGENT HMST KT MTR STRL THRMB (HEMOSTASIS) ×1
ALLOGRAFT BONE FIBER KORE 1CC (Bone Implant) IMPLANT
BASIN KIT SINGLE STR (MISCELLANEOUS) ×1 IMPLANT
BASKET BONE COLLECTION (BASKET) IMPLANT
BULB RESERV EVAC DRAIN JP 100C (MISCELLANEOUS) IMPLANT
BUR NEURO DRILL SOFT 3.0X3.8M (BURR) ×1 IMPLANT
DERMABOND ADVANCED (GAUZE/BANDAGES/DRESSINGS) ×1
DERMABOND ADVANCED .7 DNX12 (GAUZE/BANDAGES/DRESSINGS) ×1 IMPLANT
DRAIN CHANNEL JP 10F RND 20C F (MISCELLANEOUS) IMPLANT
DRAPE C ARM PK CFD 31 SPINE (DRAPES) ×1 IMPLANT
DRAPE LAPAROTOMY 77X122 PED (DRAPES) ×1 IMPLANT
DRAPE MICROSCOPE SPINE 48X150 (DRAPES) ×1 IMPLANT
ELECT REM PT RETURN 9FT ADLT (ELECTROSURGICAL) ×1
ELECTRODE REM PT RTRN 9FT ADLT (ELECTROSURGICAL) ×1 IMPLANT
FEE INTRAOP CADWELL SUPPLY NCS (MISCELLANEOUS) IMPLANT
FEE INTRAOP MONITOR IMPULS NCS (MISCELLANEOUS) IMPLANT
GLOVE BIOGEL PI IND STRL 6.5 (GLOVE) ×2 IMPLANT
GLOVE BIOGEL PI IND STRL 8.5 (GLOVE) ×1 IMPLANT
GLOVE SURG SYN 6.5 ES PF (GLOVE) ×4 IMPLANT
GLOVE SURG SYN 6.5 PF PI (GLOVE) ×2 IMPLANT
GLOVE SURG SYN 8.5  E (GLOVE) ×4
GLOVE SURG SYN 8.5 E (GLOVE) ×4 IMPLANT
GLOVE SURG SYN 8.5 PF PI (GLOVE) ×3 IMPLANT
GOWN SRG LRG LVL 4 IMPRV REINF (GOWNS) ×2 IMPLANT
GOWN SRG XL LVL 3 NONREINFORCE (GOWNS) ×1 IMPLANT
GOWN STRL NON-REIN TWL XL LVL3 (GOWNS) ×1
GOWN STRL REIN LRG LVL4 (GOWNS) ×2
INTRAOP CADWELL SUPPLY FEE NCS (MISCELLANEOUS) ×1
INTRAOP DISP SUPPLY FEE NCS (MISCELLANEOUS) ×1
INTRAOP MONITOR FEE IMPULS NCS (MISCELLANEOUS) ×1
INTRAOP MONITOR FEE IMPULSE (MISCELLANEOUS) ×1
KIT TURNOVER KIT A (KITS) ×1 IMPLANT
MANIFOLD NEPTUNE II (INSTRUMENTS) ×1 IMPLANT
NDL SAFETY ECLIP 18X1.5 (MISCELLANEOUS) ×1 IMPLANT
NS IRRIG 500ML POUR BTL (IV SOLUTION) IMPLANT
PACK LAMINECTOMY NEURO (CUSTOM PROCEDURE TRAY) ×1 IMPLANT
PAD ARMBOARD 7.5X6 YLW CONV (MISCELLANEOUS) ×2 IMPLANT
PIN CASPAR 14 (PIN) ×1 IMPLANT
PIN CASPAR 14MM (PIN) ×1
PLATE ANT CERV XTEND 2 LV 32 (Plate) IMPLANT
SCREW VAR 4.2 XD SELF DRILL 16 (Screw) IMPLANT
SPACER C HEDRON 12X14 7M 7D (Spacer) IMPLANT
SPACER HEDRON C 12X14X8 7D (Spacer) IMPLANT
SPONGE KITTNER 5P (MISCELLANEOUS) ×1 IMPLANT
STAPLER SKIN PROX 35W (STAPLE) IMPLANT
SURGIFLO W/THROMBIN 8M KIT (HEMOSTASIS) ×1 IMPLANT
SUT DVC VLOC 90 3-0 CV23 UNDY (SUTURE) IMPLANT
SUT VIC AB 3-0 SH 8-18 (SUTURE) ×1 IMPLANT
SYR 20ML LL LF (SYRINGE) ×1 IMPLANT
TAPE CLOTH 3X10 WHT NS LF (GAUZE/BANDAGES/DRESSINGS) ×3 IMPLANT
TRAP FLUID SMOKE EVACUATOR (MISCELLANEOUS) ×1 IMPLANT

## 2022-01-15 NOTE — OR Nursing (Signed)
Dr. Izora Ribas in to see pt in postop 1505.  Advises ok to d/c to home now - 4 hrs if from close of incision time.

## 2022-01-15 NOTE — Anesthesia Preprocedure Evaluation (Signed)
Anesthesia Evaluation  Patient identified by MRN, date of birth, ID band Patient awake    Reviewed: Allergy & Precautions, H&P , NPO status , Patient's Chart, lab work & pertinent test results, reviewed documented beta blocker date and time   Airway Mallampati: II  TM Distance: >3 FB Neck ROM: full    Dental  (+) Teeth Intact   Pulmonary neg pulmonary ROS,    Pulmonary exam normal        Cardiovascular Exercise Tolerance: Poor hypertension, Pt. on medications and On Medications negative cardio ROS Normal cardiovascular exam Rhythm:regular Rate:Normal     Neuro/Psych  Neuromuscular disease negative psych ROS   GI/Hepatic Neg liver ROS, GERD  Medicated,  Endo/Other  Morbid obesity  Renal/GU negative Renal ROS  negative genitourinary   Musculoskeletal   Abdominal   Peds  Hematology negative hematology ROS (+)   Anesthesia Other Findings Past Medical History: No date: Arthritis 04/26/2015: Dysplastic nevus     Comment:  Left sup. med. scapula. Moderate atypia, margins free. 04/26/2015: Dysplastic nevus     Comment:  Low back spinal. Mild atypia, deep margin involved. 04/26/2015: Dysplastic nevus     Comment:  Left posterior side. Mild atypia, lateral margin               involved. 04/26/2015: Dysplastic nevus     Comment:  Left lateral waistline. Mild atypia, lateral and deep               margin involved.  04/27/2019: Dysplastic nevus     Comment:  Right post. shoulder. Moderate atypia, close to margin 04/27/2019: Dysplastic nevus     Comment:  Left low back paraspinal. Moderate atypia, peripheral               margin involved. 04/27/2019: Dysplastic nevus     Comment:  Right low back paraspinal above sacral. Moderate atypia,              margins free.  No date: Hypertension No date: Neuromuscular disorder (HCC)     Comment:  numbness right hand No date: Plantar fasciitis, bilateral     Comment:  right  greater that left,  diagnosed by Dr.  Claris Che No date: Pre-diabetes Past Surgical History: No date: CARPAL TUNNEL RELEASE; Bilateral No date: COLONOSCOPY No date: HIP SURGERY     Comment:  right,  [facility] bone graft 2011: MECKEL DIVERTICULUM EXCISION     Comment:  [name] No date: SHOULDER DEBRIDEMENT     Comment:  x 2 No date: WISDOM TOOTH EXTRACTION BMI    Body Mass Index: 40.61 kg/m     Reproductive/Obstetrics negative OB ROS                             Anesthesia Physical Anesthesia Plan  ASA: 3  Anesthesia Plan: General ETT   Post-op Pain Management:    Induction:   PONV Risk Score and Plan:   Airway Management Planned:   Additional Equipment:   Intra-op Plan:   Post-operative Plan:   Informed Consent: I have reviewed the patients History and Physical, chart, labs and discussed the procedure including the risks, benefits and alternatives for the proposed anesthesia with the patient or authorized representative who has indicated his/her understanding and acceptance.     Dental Advisory Given  Plan Discussed with: CRNA  Anesthesia Plan Comments:         Anesthesia Quick Evaluation

## 2022-01-15 NOTE — Transfer of Care (Signed)
Immediate Anesthesia Transfer of Care Note  Patient: Michael Taylor  Procedure(s) Performed: C5-7 ANTERIOR CERVICAL DISCECTOMY AND FUSION (GLOBUS HEDRON) (Spine Cervical)  Patient Location: PACU  Anesthesia Type:General  Level of Consciousness: drowsy  Airway & Oxygen Therapy: Patient Spontanous Breathing and Patient connected to face mask oxygen  Post-op Assessment: Report given to RN and Post -op Vital signs reviewed and stable  Post vital signs: Reviewed and stable  Last Vitals:  Vitals Value Taken Time  BP 138/66 01/15/22 1149  Temp 36.1 1149  Pulse 103 01/15/22 1152  Resp 21 01/15/22 1152  SpO2 95 % 01/15/22 1153  Vitals shown include unvalidated device data.  Last Pain:  Vitals:   01/15/22 0711  TempSrc: Oral  PainSc: 2          Complications: No notable events documented.

## 2022-01-15 NOTE — Op Note (Addendum)
Indications: Mr. Edman is a 51 yo male who presented with:  Cervical myelopathy G95.9, Cervical radiculopathy M54.12, Cervical stenosis of spinal canal M48.02, Right arm weakness R29.898  He failed conservative management prompting surgical intervention  Findings: cervical stenosis  Preoperative Diagnosis: Cervical myelopathy G95.9, Cervical radiculopathy M54.12, Cervical stenosis of spinal canal M48.02, Right arm weakness R29.898 Postoperative Diagnosis: same   EBL: 50 ml IVF: see AR ml Drains: none Disposition: Extubated and Stable to PACU Complications: none  No foley catheter was placed.   Preoperative Note:   Risks of surgery discussed include: infection, bleeding, stroke, coma, death, paralysis, CSF leak, nerve/spinal cord injury, numbness, tingling, weakness, complex regional pain syndrome, recurrent stenosis and/or disc herniation, vascular injury, development of instability, neck/back pain, need for further surgery, persistent symptoms, development of deformity, and the risks of anesthesia. The patient understood these risks and agreed to proceed.  Operative Note:   Procedure:  1) Anterior cervical diskectomy and fusion at C5/6 and C6/7 2) Anterior cervical instrumentation at C5 - 7 using Globus Xtend 3) Placement of biomechanical devices at C5/6 and C6/7  4) Use of operative microscope 5) Use of flouroscopy   Procedure: After obtaining informed consent, the patient taken to the operating room, placed in supine position, general anesthesia induced.  The patient had a small shoulder roll placed behind their shoulders.  The patient received preop antibiotics and IV Decadron.  The patient had a neck incision outlined, was prepped and draped in usual sterile fashion. The incision was injected with local anesthetic.   An incision was opened, dissection taken down medial to the carotid artery and jugular vein, lateral to the trachea and esophagus.  The prevertebral fascia  identified and a localizing x-ray demonstrated the correct level.  The longus colli were dissected laterally, and self-retaining retractors placed to open the operative field. The microscope was then brought into the field.  With this complete, distractor pins were placed in the vertebral bodies of C5 and C7. The distractor was placed, and the annuli at C5/6 and C6/7 were opened using a bovie.  Curettes and pituitary rongeurs used to remove the majority of disk, then the drill was used to remove the posterior osteophyte and begin the foraminotomies. The nerve hook was used to elevate the posterior longitudinal ligament, which was then removed with Kerrison rongeurs. The microblunt nerve hook could be passed out the foramina bilaterally at each level.   Meticulous hemostasis was obtained.  A biomechanical device (Globus Hedron 7 mm height x 14 mm width by 12 mm depth) was placed at C5/6. A second biomechanical device (Globus Hedron 8 mm height x 14 mm width by 12 mm depth) was placed at C6/7. Each device had been filled with allograft for aid in arthrodesis.  The caspar distractor was removed, and bone wax used for hemostasis. A separate, 32 mm Globus Xtend plate was chosen.  Two screws placed in each vertebral body, respectively making sure the screws were behind the locking mechanism.  Final AP and lateral radiographs were taken.   Please note that the plate is not inclusive to the biomechanical devices.  The anchoring mechanism of the plate is completely separate from the biomechanical devices.  With everything in good position, the wound was irrigated copiously and meticulous hemostasis obtained.  Wound was closed in 2 layers using interrupted inverted 3-0 Vicryl sutures in the platysma and 3-0 monocryl on the dermis.  The wound was dressed with dermabond, the head of bed at 30 degrees, taken  to recovery room in stable condition.  No new postop neurological deficits were identified.  Sponge and pattie  counts were correct at the end of the procedure.     I performed the entire procedure with the assistance of Geronimo Boot PA as an Pensions consultant.  An Pensions consultant was required for this procedure for successful dissection and retraction of the soft tissues to prevent injury.  I performed the key portions of the procedure.  Meade Maw MD

## 2022-01-15 NOTE — Anesthesia Procedure Notes (Signed)
Procedure Name: Intubation Date/Time: 01/15/2022 8:42 AM  Performed by: Cammie Sickle, CRNAPre-anesthesia Checklist: Patient identified, Patient being monitored, Timeout performed, Emergency Drugs available and Suction available Patient Re-evaluated:Patient Re-evaluated prior to induction Oxygen Delivery Method: Circle system utilized Preoxygenation: Pre-oxygenation with 100% oxygen Induction Type: IV induction Ventilation: Mask ventilation without difficulty Laryngoscope Size: 3 and McGraph Grade View: Grade I Tube type: Oral Tube size: 7.5 mm Number of attempts: 1 Airway Equipment and Method: Stylet Placement Confirmation: ETT inserted through vocal cords under direct vision, positive ETCO2 and breath sounds checked- equal and bilateral Secured at: 22 cm Tube secured with: Tape Dental Injury: Teeth and Oropharynx as per pre-operative assessment

## 2022-01-15 NOTE — Discharge Instructions (Addendum)
Your surgeon has performed an operation on your cervical spine (neck) to relieve pressure on the spinal cord and/or nerves. This involved making an incision in the front of your neck and removing one or more of the discs that support your spine. Next, a small piece of bone, a titanium plate, and screws were used to fuse two or more of the vertebrae (bones) together.  The following are instructions to help in your recovery once you have been discharged from the hospital. Even if you feel well, it is important that you follow these activity guidelines. If you do not let your neck heal properly from the surgery, you can increase the chance of return of your symptoms and other complications.  * Do not take anti-inflammatory medications for 3 months after surgery (naproxen [Aleve], ibuprofen [Advil, Motrin] etc.). These medications can prevent your bones from healing properly.  If needed, celebrex is ok to take.  Please contact us if you would like a prescription.  Activity    No bending, lifting, or twisting ("BLT"). Avoid lifting objects heavier than 10 pounds (gallon milk jug).  Where possible, avoid household activities that involve lifting, bending, reaching, pushing, or pulling such as laundry, vacuuming, grocery shopping, and childcare. Try to arrange for help from friends and family for these activities while your back heals.  Increase physical activity slowly as tolerated.  Taking short walks is encouraged, but avoid strenuous exercise. Do not jog, run, bicycle, lift weights, or participate in any other exercises unless specifically allowed by your doctor.  Talk to your doctor before resuming sexual activity.  You should not drive until cleared by your doctor.  Until released by your doctor, you should not return to work or school.  You should rest at home and let your body heal.   You may shower 1 day after your surgery.  After showering, lightly dab your incision dry. Do not take a tub  bath or go swimming until approved by your doctor at your follow-up appointment.  If you smoke, we strongly recommend that you quit.  Smoking has been proven to interfere with normal bone healing and will dramatically reduce the success rate of your surgery. Please contact [facility] (800-QUIT-NOW) and use the resources at www.[facility].com for assistance in stopping smoking.  Surgical Incision   If you have a dressing on your incision, you may remove it two days after your surgery. Keep your incision area clean and dry.  If you have staples or stitches on your incision, you should have a follow up scheduled for removal. If you do not have staples or stitches, you will have steri-strips (small pieces of surgical tape) or Dermabond glue. The steri-strips/glue should begin to peel away within about a week (it is fine if the steri-strips fall off before then). If the strips are still in place one week after your surgery, you may gently remove them.  Diet           You may return to your usual diet. However, you may experience discomfort when swallowing in the first month after your surgery. This is normal. You may find that softer foods are more comfortable for you to swallow. Be sure to stay hydrated.  When to Contact us  You may experience pain in your neck and/or pain between your shoulder blades. This is normal and should improve in the next few weeks with the help of pain medication, muscle relaxers, and rest. Some patients report that a warm compress on the back  of the neck or between the shoulder blades helps.  However, should you experience any of the following, contact us immediately: New numbness or weakness Pain that is progressively getting worse, and is not relieved by your pain medication, muscle relaxers, rest, and warm compresses Bleeding, redness, swelling, pain, or drainage from surgical incision Chills or flu-like symptoms Fever greater than 101.0 F (38.3 C) Inability to eat,  drink fluids, or take medications Problems with bowel or bladder functions Difficulty breathing or shortness of breath Warmth, tenderness, or swelling in your calf Contact Information During office hours (Monday-Friday 9 am to 5 pm), please call your physician at [phone] and ask for Berdine Addison After hours and weekends, please call [phone] and speak with the neurosurgeon on call For a life-threatening emergency, call [facility]   The drugs that you were given will stay in your system until tomorrow so for the next 24 hours you should not:  Drive an automobile Make any legal decisions Drink any alcoholic beverage   You may resume regular meals tomorrow.  Today it is better to start with liquids and gradually work up to solid foods.  You may eat anything you prefer, but it is better to start with liquids, then soup and crackers, and gradually work up to solid foods.   Please notify your doctor immediately if you have any unusual bleeding, trouble breathing, redness and pain at the surgery site, drainage, fever, or pain not relieved by medication.    Additional Instructions:        Please contact your physician with any problems or Same Day Surgery at [phone], Monday through Friday 6 am to 4 pm, or [place] at [facility] number at [phone].

## 2022-01-15 NOTE — Discharge Summary (Signed)
Physician Discharge Summary  Patient ID: Michael Taylor MRN: [id number] DOB/AGE: 1970/12/03 51 y.o.  Admit date: 01/15/2022 Discharge date: 01/15/2022  Admission Diagnoses: Cervical myelopathy, cervical stenosis, cervical radiculopathy, right arm weakness  Discharge Diagnoses:  Active Problems:   Cervical myelopathy (HCC)   Spinal stenosis in cervical region   Cervical radiculitis   Right arm weakness   Discharged Condition: good  Hospital Course: [name] was admitted for the above indications.  He underwent C5-7 anterior cervical discectomy and fusion.  He tolerated the procedure well, and was stable for discharge.  Consults: None  Significant Diagnostic Studies: radiology: X-Ray: C5-7 ACDF  Treatments: surgery: C5-7 ACDF  Discharge Exam: Blood pressure (!) 157/106, pulse 82, temperature 97.9 F (36.6 C), temperature source Oral, resp. rate 18, height '5\' 9"'$  (1.753 m), weight 124.7 kg, SpO2 97 %. General appearance: alert and cooperative CNI Moves all extremities well Trachea midline  Disposition: Discharge disposition: 01-Home or Self Care       Discharge Instructions     Discharge patient   Complete by: As directed    Discharge disposition: 01-Home or Self Care   Discharge patient date: 01/15/2022      Allergies as of 01/15/2022   No Known Allergies      Medication List     STOP taking these medications    meloxicam 15 MG tablet Commonly known as: MOBIC       TAKE these medications    amLODipine 10 MG tablet Commonly known as: NORVASC Take 1 tablet (10 mg total) by mouth daily.   baclofen 10 MG tablet Commonly known as: LIORESAL Take 0.5-1 tablets (5-10 mg total) by mouth 3 (three) times daily as needed for muscle spasms.   ergocalciferol 1.25 MG (50000 UT) capsule Commonly known as: VITAMIN D2 Take 50,000 Units by mouth once a week.   loratadine 10 MG tablet Commonly known as: CLARITIN Take 10 mg by mouth daily.   losartan 100 MG  tablet Commonly known as: COZAAR TAKE 1 TABLET BY MOUTH EVERY DAY   metFORMIN 500 MG 24 hr tablet Commonly known as: GLUCOPHAGE-XR Take 1 tablet (500 mg total) by mouth daily with breakfast.   multivitamin tablet Take 1 tablet by mouth daily.   omeprazole 20 MG capsule Commonly known as: PRILOSEC Take 1 capsule (20 mg total) by mouth daily.   oxyCODONE 5 MG immediate release tablet Commonly known as: Roxicodone Take 1 tablet (5 mg total) by mouth every 4 (four) hours as needed for up to 7 days for moderate pain.   vitamin B-12 100 MCG tablet Commonly known as: CYANOCOBALAMIN Take 100 mcg by mouth daily.        Follow-up Information     Geronimo Boot, PA-C Follow up in 2 week(s).   Specialty: Neurosurgery Why: as previously scheduled Contact information: [address] [place] [address] [phone]                 Signed: Meade Maw 01/15/2022, 11:45 AM

## 2022-01-15 NOTE — Interval H&P Note (Signed)
History and Physical Interval Note:  01/15/2022 7:45 AM  Michael Taylor  has presented today for surgery, with the diagnosis of Cervical myelopathy G95.9 Cervical radiculopathy M54.12 Cervical stenosis of spinal canal M48.02 Right arm weakness R29.898.  The various methods of treatment have been discussed with the patient and family. After consideration of risks, benefits and other options for treatment, the patient has consented to  Procedure(s): C5-7 ANTERIOR CERVICAL DISCECTOMY AND FUSION (GLOBUS HEDRON) (N/A) as a surgical intervention.  The patient's history has been reviewed, patient examined, no change in status, stable for surgery.  I have reviewed the patient's chart and labs.  Questions were answered to the patient's satisfaction.    Heart sounds normal no MRG. Chest Clear to Auscultation Bilaterally.   Carissa Musick

## 2022-01-16 ENCOUNTER — Telehealth: Payer: Self-pay

## 2022-01-16 ENCOUNTER — Encounter: Payer: Self-pay | Admitting: Neurosurgery

## 2022-01-16 MED ORDER — METHYLPREDNISOLONE 4 MG PO TBPK: ORAL_TABLET | ORAL | 0 refills | Status: DC

## 2022-01-16 NOTE — Telephone Encounter (Signed)
Per discussion with Dr Izora Ribas, sent rx for medrol dosepack and advised the patient that he can pick it up and take it if the swelling gets worse/doesn't start to improve.

## 2022-01-16 NOTE — Telephone Encounter (Signed)
-----   Message from Peggyann Shoals sent at 01/16/2022  3:50 PM EDT ----- Regarding: postop swelling Contact: [phone]  C5-7 ACDF on 01/15/22 Patient is calling that he has swelling on both sides of his face. It's more around the sideburn area, down into his jaw. He noticed the swelling around 11am today and he feels it got worse through out the day. No swelling in his neck. He can swallow. His throat hurts and burns alittle. He is not able to send a picture through his mychart.

## 2022-01-20 NOTE — Anesthesia Postprocedure Evaluation (Signed)
Anesthesia Post Note  Patient: CHANCY SMIGIEL  Procedure(s) Performed: C5-7 ANTERIOR CERVICAL DISCECTOMY AND FUSION (GLOBUS HEDRON) (Spine Cervical)  Patient location during evaluation: PACU Anesthesia Type: General Level of consciousness: awake and alert Pain management: pain level controlled Vital Signs Assessment: post-procedure vital signs reviewed and stable Respiratory status: spontaneous breathing, nonlabored ventilation, respiratory function stable and patient connected to nasal cannula oxygen Cardiovascular status: blood pressure returned to baseline and stable Postop Assessment: no apparent nausea or vomiting Anesthetic complications: no   No notable events documented.   Last Vitals:  Vitals:   01/15/22 1443 01/15/22 1520  BP: (!) 148/89 (!) 145/88  Pulse: 99 99  Resp: 18 16  Temp:  36.8 C  SpO2: 94% 95%    Last Pain:  Vitals:   01/15/22 1520  TempSrc: Temporal  PainSc: [place] Wacey Zieger

## 2022-01-21 ENCOUNTER — Encounter: Payer: BC Managed Care – PPO | Admitting: Family Medicine

## 2022-01-21 ENCOUNTER — Other Ambulatory Visit: Payer: Self-pay | Admitting: Family Medicine

## 2022-01-21 DIAGNOSIS — I10 Essential (primary) hypertension: Secondary | ICD-10-CM

## 2022-01-22 NOTE — Telephone Encounter (Signed)
Requested Prescriptions  Pending Prescriptions Disp Refills  . losartan (COZAAR) 100 MG tablet [Pharmacy Med Name: LOSARTAN POTASSIUM 100 MG TAB] 90 tablet 0    Sig: TAKE 1 TABLET BY MOUTH EVERY DAY     Cardiovascular:  Angiotensin Receptor Blockers Failed - 01/21/2022  2:28 AM      Failed - Last BP in normal range    BP Readings from Last 1 Encounters:  01/15/22 (!) 145/88         Passed - Cr in normal range and within 180 days    Creat  Date Value Ref Range Status  10/10/2020 0.93 0.70 - 1.33 mg/dL Final    Comment:    For patients >43 years of age, the reference limit for Creatinine is approximately 13% higher for people identified as African-American. .    Creatinine, Ser  Date Value Ref Range Status  01/06/2022 0.83 0.61 - 1.24 mg/dL Final         Passed - K in normal range and within 180 days    Potassium  Date Value Ref Range Status  01/06/2022 3.9 3.5 - 5.1 mmol/L Final         Passed - Patient is not pregnant      Passed - Valid encounter within last 6 months    Recent Outpatient Visits          3 months ago Pre-diabetes   [facility], Devonne Doughty, DO   6 months ago Pre-diabetes   [facility] Olin Hauser, DO   11 months ago Pre-diabetes   [place], Devonne Doughty, DO   1 year ago Annual physical exam   [facility] Olin Hauser, DO   1 year ago [place], DO      Future Appointments            In 2 months Ralene Bathe, MD [place]

## 2022-01-29 NOTE — Progress Notes (Unsigned)
   REFERRING PHYSICIAN:  Meade Maw, [place],  [place] [address]  DOS: ACDF C5-C7 01/15/22 for cervical myelopathy and right arm weakness.   HISTORY OF PRESENT ILLNESS: Yuvaan Olander Majeed is 2 weeks status post ACDF C5-C7. Given baclofen, celebrex, and oxycodone on discharge from the hospital. Called in medrol dose pack on POD#1.      PHYSICAL EXAMINATION:  NEUROLOGICAL:  General: In no acute distress.   Awake, alert, oriented to person, place, and time.  Pupils equal round and reactive to light.  Facial tone is symmetric.  Tongue protrusion is midline.  There is no pronator drift.  Strength: Side Biceps Triceps Deltoid Interossei Grip Wrist Ext. Wrist Flex.  R '5 5 5 5 5 5 5  '$ L '5 5 5 5 5 5 5   '$ Incision c/d/I  Imaging:  Nothing new to review.   Assessment / Plan: DEANDREA RION is doing well s/p above surgery. Treatment options reviewed with patient and following plan made:   - We discussed activity escalation and I have advised the patient to lift up to 10 pounds until 6 weeks after surgery (until your follow up with Dr. Izora Ribas).  T - Reviewed wound care.  - Continue current medications including *** - Follow up as scheduled in 4 weeks and prn.   Advised to contact the office if any questions or concerns arise.   Geronimo Boot PA-C Dept of Neurosurgery

## 2022-01-30 ENCOUNTER — Encounter: Payer: Self-pay | Admitting: Orthopedic Surgery

## 2022-01-30 ENCOUNTER — Ambulatory Visit (INDEPENDENT_AMBULATORY_CARE_PROVIDER_SITE_OTHER): Payer: BC Managed Care – PPO | Admitting: Orthopedic Surgery

## 2022-01-30 VITALS — BP 144/82 | Temp 98.6°F | Ht 69.0 in | Wt 275.0 lb

## 2022-01-30 DIAGNOSIS — R29898 Other symptoms and signs involving the musculoskeletal system: Secondary | ICD-10-CM

## 2022-01-30 DIAGNOSIS — G959 Disease of spinal cord, unspecified: Secondary | ICD-10-CM

## 2022-01-30 DIAGNOSIS — Z09 Encounter for follow-up examination after completed treatment for conditions other than malignant neoplasm: Secondary | ICD-10-CM

## 2022-01-30 DIAGNOSIS — Z981 Arthrodesis status: Secondary | ICD-10-CM

## 2022-02-25 ENCOUNTER — Other Ambulatory Visit: Payer: Self-pay

## 2022-02-25 DIAGNOSIS — Z981 Arthrodesis status: Secondary | ICD-10-CM

## 2022-02-27 ENCOUNTER — Ambulatory Visit
Admission: RE | Admit: 2022-02-27 | Discharge: 2022-02-27 | Disposition: A | Discharge status: Home / Self Care | Payer: BC Managed Care – PPO | Attending: Neurosurgery | Admitting: Neurosurgery

## 2022-02-27 ENCOUNTER — Ambulatory Visit (INDEPENDENT_AMBULATORY_CARE_PROVIDER_SITE_OTHER): Payer: BC Managed Care – PPO | Admitting: Neurosurgery

## 2022-02-27 ENCOUNTER — Ambulatory Visit
Admission: RE | Admit: 2022-02-27 | Discharge: 2022-02-27 | Disposition: A | Discharge status: Home / Self Care | Payer: BC Managed Care – PPO | Source: Ambulatory Visit | Attending: Neurosurgery | Admitting: Neurosurgery

## 2022-02-27 ENCOUNTER — Encounter: Payer: Self-pay | Admitting: Neurosurgery

## 2022-02-27 VITALS — BP 126/80 | Ht 69.0 in | Wt 275.0 lb

## 2022-02-27 DIAGNOSIS — M2578 Osteophyte, vertebrae: Secondary | ICD-10-CM | POA: Diagnosis not present

## 2022-02-27 DIAGNOSIS — Z981 Arthrodesis status: Secondary | ICD-10-CM | POA: Insufficient documentation

## 2022-02-27 DIAGNOSIS — M5412 Radiculopathy, cervical region: Secondary | ICD-10-CM

## 2022-02-27 DIAGNOSIS — G959 Disease of spinal cord, unspecified: Secondary | ICD-10-CM

## 2022-02-27 DIAGNOSIS — Z09 Encounter for follow-up examination after completed treatment for conditions other than malignant neoplasm: Secondary | ICD-10-CM

## 2022-02-27 DIAGNOSIS — M4802 Spinal stenosis, cervical region: Secondary | ICD-10-CM

## 2022-02-27 NOTE — Progress Notes (Signed)
   REFERRING PHYSICIAN:  Andee Poles [place],  [place] [address]  DOS: ACDF C5-C7 01/15/22 for cervical myelopathy and right arm weakness.   HISTORY OF PRESENT ILLNESS: Michael Taylor is status post ACDF C5-C7. He is doing very well.  He has had improvement in his RUE strength.    PHYSICAL EXAMINATION:  NEUROLOGICAL:  General: In no acute distress.   Awake, alert, oriented to person, place, and time.  Pupils equal round and reactive to light.  Facial tone is symmetric.  Tongue protrusion is midline.  There is no pronator drift.  Strength: Side Biceps Triceps Deltoid Interossei Grip Wrist Ext. Wrist Flex.  R 5 4+ '5 5 5 5 5  '$ L '5 5 5 5 5 5 5   '$ Incision c/d/I  Imaging:  No complications noted  Assessment / Plan: Michael Taylor is doing well s/p above surgery.   We discussed activity limitations.  I am pleased with his progress.  Meade Maw MD Dept of Neurosurgery

## 2022-02-28 ENCOUNTER — Other Ambulatory Visit: Payer: Self-pay | Admitting: Family Medicine

## 2022-02-28 DIAGNOSIS — I10 Essential (primary) hypertension: Secondary | ICD-10-CM

## 2022-02-28 NOTE — Telephone Encounter (Signed)
Requested Prescriptions  Pending Prescriptions Disp Refills  . amLODipine (NORVASC) 10 MG tablet [Pharmacy Med Name: AMLODIPINE BESYLATE 10 MG TAB] 90 tablet 0    Sig: TAKE 1 TABLET BY MOUTH EVERY DAY     Cardiovascular: Calcium Channel Blockers 2 Passed - 02/28/2022  1:30 AM      Passed - Last BP in normal range    BP Readings from Last 1 Encounters:  02/27/22 126/80         Passed - Last Heart Rate in normal range    Pulse Readings from Last 1 Encounters:  01/15/22 99         Passed - Valid encounter within last 6 months    Recent Outpatient Visits          4 months ago Pre-diabetes   [facility] [place], Devonne Doughty, DO   8 months ago Pre-diabetes   [place], Devonne Doughty, DO   1 year ago Pre-diabetes   [place], Devonne Doughty, DO   1 year ago Annual physical exam   [facility] Olin Hauser, DO   1 year ago [place], DO      Future Appointments            In 1 month Nehemiah Massed Monia Sabal, MD [place]

## 2022-03-13 ENCOUNTER — Other Ambulatory Visit: Payer: Self-pay | Admitting: Neurosurgery

## 2022-04-07 ENCOUNTER — Other Ambulatory Visit: Payer: Self-pay

## 2022-04-07 DIAGNOSIS — Z981 Arthrodesis status: Secondary | ICD-10-CM

## 2022-04-08 ENCOUNTER — Ambulatory Visit
Admission: RE | Admit: 2022-04-08 | Discharge: 2022-04-08 | Disposition: A | Discharge status: Home / Self Care | Payer: BC Managed Care – PPO | Attending: Neurosurgery | Admitting: Neurosurgery

## 2022-04-08 ENCOUNTER — Ambulatory Visit
Admission: RE | Admit: 2022-04-08 | Discharge: 2022-04-08 | Disposition: A | Discharge status: Home / Self Care | Payer: BC Managed Care – PPO | Source: Ambulatory Visit | Attending: Neurosurgery | Admitting: Neurosurgery

## 2022-04-08 ENCOUNTER — Ambulatory Visit (INDEPENDENT_AMBULATORY_CARE_PROVIDER_SITE_OTHER): Payer: BC Managed Care – PPO | Admitting: Neurosurgery

## 2022-04-08 ENCOUNTER — Encounter: Payer: Self-pay | Admitting: Neurosurgery

## 2022-04-08 VITALS — BP 168/94 | HR 91 | Temp 98.1°F | Wt 275.4 lb

## 2022-04-08 DIAGNOSIS — Z981 Arthrodesis status: Secondary | ICD-10-CM | POA: Insufficient documentation

## 2022-04-08 DIAGNOSIS — M4802 Spinal stenosis, cervical region: Secondary | ICD-10-CM

## 2022-04-08 DIAGNOSIS — Z09 Encounter for follow-up examination after completed treatment for conditions other than malignant neoplasm: Secondary | ICD-10-CM

## 2022-04-08 DIAGNOSIS — M5412 Radiculopathy, cervical region: Secondary | ICD-10-CM

## 2022-04-08 DIAGNOSIS — G959 Disease of spinal cord, unspecified: Secondary | ICD-10-CM

## 2022-04-08 NOTE — Progress Notes (Signed)
   REFERRING PHYSICIAN:  Andee Poles [place],  [place] [address]  DOS: ACDF C5-C7 01/15/22 for cervical myelopathy and right arm weakness.   HISTORY OF PRESENT ILLNESS: Michael Taylor is status post ACDF C5-C7. He is doing very well.  He has had improvement in his RUE strength. He has occasional discomfort with looking down, but is doing very well.  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   PHYSICAL EXAMINATION:  NEUROLOGICAL:  General: In no acute distress.   Awake, alert, oriented to person, place, and time.  Pupils equal round and reactive to light.  Facial tone is symmetric.  Tongue protrusion is midline.  There is no pronator drift.  Strength: Side Biceps Triceps Deltoid Interossei Grip Wrist Ext. Wrist Flex.  R '5 5 5 5 5 5 5  '$ L '5 5 5 5 5 5 5   '$ Incision c/d/I  Imaging:  No complications noted  Assessment / Plan: TIMITHY ARONS is doing well s/p above surgery.   We discussed activity limitations.  I have released him to go hunting next week.  I advised him to use the smallest caliber firearm that he was comfortable using.  He will start physical therapy for his hip replacement after the surgery.  He may benefit from right shoulder physical therapy as well.  If his treatment team at [facility] does not write him for that, I have asked him to contact me and I will write it for him.    I am pleased with his progress.  I will see him back in 6 months with x-rays.  Meade Maw MD Dept of Neurosurgery

## 2022-04-21 ENCOUNTER — Ambulatory Visit: Payer: BC Managed Care – PPO | Admitting: Dermatology

## 2022-04-22 ENCOUNTER — Other Ambulatory Visit: Payer: Self-pay | Admitting: Family Medicine

## 2022-04-22 DIAGNOSIS — I10 Essential (primary) hypertension: Secondary | ICD-10-CM

## 2022-04-22 NOTE — Telephone Encounter (Signed)
Pt called appointment made. Pt states he just had hip replacement last week and wanted to schedule follow up in February.

## 2022-04-22 NOTE — Telephone Encounter (Signed)
Requested Prescriptions  Pending Prescriptions Disp Refills   losartan (COZAAR) 100 MG tablet [Pharmacy Med Name: LOSARTAN POTASSIUM 100 MG TAB] 90 tablet 0    Sig: TAKE 1 TABLET BY MOUTH EVERY DAY     Cardiovascular:  Angiotensin Receptor Blockers Failed - 04/22/2022  1:53 AM      Failed - Last BP in normal range    BP Readings from Last 1 Encounters:  04/08/22 (!) 168/94         Failed - Valid encounter within last 6 months    Recent Outpatient Visits           6 months ago Pre-diabetes   [facility] Olin Hauser, DO   9 months ago Pre-diabetes   [place], Devonne Doughty, DO   1 year ago [place], Devonne Doughty, DO   1 year ago Annual physical exam   [facility] Olin Hauser, DO   1 year ago Pre-diabetes   [place], DO       Future Appointments             In 2 months [place], Devonne Doughty, DO [facility] [place], [place]   In 2 months Ralene Bathe, MD [place] in normal range and within 180 days    Creat  Date Value Ref Range Status  10/10/2020 0.93 0.70 - 1.33 mg/dL Final    Comment:    For patients >67 years of age, the reference limit for Creatinine is approximately 13% higher for people identified as African-American. .    Creatinine, Ser  Date Value Ref Range Status  01/06/2022 0.83 0.61 - 1.24 mg/dL Final         Passed - K in normal range and within 180 days    Potassium  Date Value Ref Range Status  01/06/2022 3.9 3.5 - 5.1 mmol/L Final         Passed - Patient is not pregnant

## 2022-05-16 ENCOUNTER — Other Ambulatory Visit: Payer: Self-pay | Admitting: Neurosurgery

## 2022-06-04 ENCOUNTER — Other Ambulatory Visit: Payer: Self-pay | Admitting: Family Medicine

## 2022-06-04 DIAGNOSIS — I10 Essential (primary) hypertension: Secondary | ICD-10-CM

## 2022-06-04 NOTE — Telephone Encounter (Signed)
Requested Prescriptions  Pending Prescriptions Disp Refills   amLODipine (NORVASC) 10 MG tablet [Pharmacy Med Name: AMLODIPINE BESYLATE 10 MG TAB] 90 tablet 0    Sig: TAKE 1 TABLET BY MOUTH EVERY DAY     Cardiovascular: Calcium Channel Blockers 2 Failed - 06/04/2022  1:34 AM      Failed - Last BP in normal range    BP Readings from Last 1 Encounters:  04/08/22 (!) 168/94         Failed - Valid encounter within last 6 months    Recent Outpatient Visits           7 months ago [place], DO   11 months ago [place], DO   1 year ago [place], DO   1 year ago Annual physical exam   [facility] Olin Hauser, DO   1 year ago [facility] Olin Hauser, DO       Future Appointments             In 2 weeks Parks Ranger Devonne Doughty, [facility], [place]   In 3 weeks Ralene Bathe, MD [place] in normal range    Pulse Readings from Last 1 Encounters:  04/08/22 91

## 2022-06-24 ENCOUNTER — Ambulatory Visit: Payer: BC Managed Care – PPO | Admitting: Family Medicine

## 2022-06-24 ENCOUNTER — Encounter: Payer: Self-pay | Admitting: Family Medicine

## 2022-06-24 VITALS — BP 124/80 | HR 83 | Ht 69.0 in | Wt 281.0 lb

## 2022-06-24 DIAGNOSIS — M159 Polyosteoarthritis, unspecified: Secondary | ICD-10-CM

## 2022-06-24 DIAGNOSIS — I10 Essential (primary) hypertension: Secondary | ICD-10-CM

## 2022-06-24 DIAGNOSIS — M4802 Spinal stenosis, cervical region: Secondary | ICD-10-CM | POA: Diagnosis not present

## 2022-06-24 DIAGNOSIS — E1169 Type 2 diabetes mellitus with other specified complication: Secondary | ICD-10-CM | POA: Diagnosis not present

## 2022-06-24 DIAGNOSIS — Z96641 Presence of right artificial hip joint: Secondary | ICD-10-CM

## 2022-06-24 LAB — POCT GLYCOSYLATED HEMOGLOBIN (HGB A1C): Hemoglobin A1C: 7.7 % — AB (ref 4.0–5.6)

## 2022-06-24 MED ORDER — MELOXICAM 15 MG PO TABS: 15.0000 mg | ORAL_TABLET | Freq: Every day | ORAL | 1 refills | Status: DC | PRN

## 2022-06-24 MED ORDER — MOUNJARO 2.5 MG/0.5ML ~~LOC~~ SOAJ: 2.5000 mg | SUBCUTANEOUS | 0 refills | Status: DC

## 2022-06-24 NOTE — Progress Notes (Signed)
Subjective:    Patient ID: Michael Taylor, male    DOB: 27-Feb-1971, 52 y.o.   MRN: [id number]  Michael Taylor is a 52 y.o. male presenting on 06/24/2022 for Prediabetes   HPI  CHRONIC HTN / Morbid Obesity BMI >41 See prior notes for background information. Improved BP. Off Amlodipine Seems BP improved with pain in hip resolved Current Meds - Losartan 156m daily Adhering to medication better now Tolerating well, w/o complaints. Denies CP, dyspnea, HA, edema, dizziness / lightheadedness   Type 2 Diabetes, new diagnosis A1c prior elevated 6 range, highest 7 Today A1c elevated again 7.7 Previously on Metformin 500 BID for PreDM but he had loose stool side effect, now off med Interested in other medication options for DIABETES and sugar control.  S/p Neck Surgery C-spine Fusion He has notable improvement in reduced numbness R fingers, still has some slight numbness But overall improvement He was placed on Celebrex course after surgery, and off of other NSAIDs he had difficulty   S/p R Hip Total Arthropasthy - 04/17/22 Had a bone spur size of his thumb Notable improvement. Dramatic improvement after replacement Mobility improved Shoulders still a bit weak, he is working on R triceps muscle and strength.  He was out for September due to neck and took off most of December for hip  Now back to work 05/2022      10/16/2021    5:27 PM 10/16/2020    2:45 PM 12/28/2019    3:51 PM  Depression screen PHQ 2/9  Decreased Interest 0 0 0  Down, Depressed, Hopeless 0 0 0  PHQ - 2 Score 0 0 0  Altered sleeping  0   Tired, decreased energy  0   Change in appetite  0   Feeling bad or failure about yourself   0   Trouble concentrating  0   Moving slowly or fidgety/restless  0   Suicidal thoughts  0   PHQ-9 Score  0   Difficult doing work/chores  Not difficult at all     Social History   Tobacco Use   Smoking status: Never   Smokeless tobacco: Never  Vaping Use   Vaping  Use: Never used  Substance Use Topics   Alcohol use: Yes    Alcohol/week: 4.0 standard drinks of alcohol    Types: 4 Shots of liquor per week    Comment: rare, < 1 x monthly   Drug use: No    Review of Systems Per HPI unless specifically indicated above     Objective:    BP 124/80   Pulse 83   Ht 5' 9"$  (1.753 m)   Wt 281 lb (127.5 kg)   SpO2 99%   BMI 41.50 kg/m   Wt Readings from Last 3 Encounters:  06/24/22 281 lb (127.5 kg)  04/08/22 275 lb 6.4 oz (124.9 kg)  02/27/22 275 lb (124.7 kg)    Physical Exam Vitals and nursing note reviewed.  Constitutional:      General: He is not in acute distress.    Appearance: Normal appearance. He is well-developed. He is obese. He is not diaphoretic.     Comments: Well-appearing, comfortable, cooperative  HENT:     Head: Normocephalic and atraumatic.  Eyes:     General:        Right eye: No discharge.        Left eye: No discharge.     Conjunctiva/sclera: Conjunctivae normal.  Cardiovascular:     Rate  and Rhythm: Normal rate.  Pulmonary:     Effort: Pulmonary effort is normal.  Skin:    General: Skin is warm and dry.     Findings: No erythema or rash.  Neurological:     Mental Status: He is alert and oriented to person, place, and time.  Psychiatric:        Mood and Affect: Mood normal.        Behavior: Behavior normal.        Thought Content: Thought content normal.     Comments: Well groomed, good eye contact, normal speech and thoughts      Results for orders placed or performed in visit on 06/24/22  POCT HgB A1C  Result Value Ref Range   Hemoglobin A1C 7.7 (A) 4.0 - 5.6 %      Assessment & Plan:   Problem List Items Addressed This Visit     Essential hypertension    Well-controlled HYPERTENSION Seems improved with better pain control now s/p hip / neck surgery No known complications  Off Amlodipine   Plan:  1.  Continue current BP regimen Losartan 156m daily 2. Encourage improved lifestyle - low  sodium diet, regular exercise 3. Continue monitor BP outside office, bring readings to next visit, if persistently >140/90 or new symptoms notify office sooner      Neuroforaminal stenosis of cervical spine    S/p cervical fusion surgery Per Dr YIzora RibasImproved function now Still some residual numbness into R fingers but overall improved He is working to maintain his strength      Relevant Medications   meloxicam (MOBIC) 15 MG tablet   Primary osteoarthritis involving multiple joints    Underlying etiology Known OA/DJD multiple joints Worse with repetitive activities S/p R Total Hip Arthroplasty with improvement.  Re order Meloxicam 116mdaily AS NEEDED, caution use nsaid      Relevant Medications   meloxicam (MOBIC) 15 MG tablet   S/P total right hip arthroplasty   Type 2 diabetes mellitus with other specified complication ([place]- Primary    New dx diabetes type 2, A1c 7.7 prior elevated as well. Inadequate response on lifestyle and metformin management Complications - Hyperglycemia, hyperlipidemia, obesity  Plan:  1. May continue Metformin for now, we may pause in future if side effect too much 2. Order rx Mounjaro 2.18m81meekly inj x 4 doses, voucher for free med given - he will notify me in 2-3 weeks if doing well on Mounjaro and I can order 18mg47mekly inj 30 day, should be preferred medication through insurance, can use manufacturer copay card for discount 2. Encourage improved lifestyle - low carb, low sugar diet, reduce portion size, continue improving regular exercise 3.  May Check CBG , bring log to next visit for review 4. Continue ARB, future Statin  3 month A1c      Relevant Medications   MOUNJARO 2.5 MG/0.5ML Pen   Other Relevant Orders   POCT HgB A1C (Completed)    Meds ordered this encounter  Medications   MOUNJARO 2.5 MG/0.5ML Pen    Sig: Inject 2.5 mg into the skin once a week.    Dispense:  2 mL    Refill:  0   meloxicam (MOBIC) 15 MG tablet     Sig: Take 1 tablet (15 mg total) by mouth daily as needed for pain.    Dispense:  90 tablet    Refill:  1     Follow up plan: Return in about 3  months (around 09/22/2022) for 3 month DM A1c.  [name], [facility] 06/24/2022, 4:17 PM

## 2022-06-24 NOTE — Patient Instructions (Addendum)
Thank you for coming to the office today.  Remain off Amlodipine 77m daily. Since at this time, BP seems controlled. And you have been off this one, it may not be needed.  Recent Labs    07/02/21 1512 10/16/21 1517 06/24/22 1623  HGBA1C 7.0* 6.3* 7.7*   Stop Metformin. Seems less effective and diarrhea side effect  Start Mounjaro 2.511mweekly inj 4 pens, 1 per week, 0 refills  Once we get approval, we can authorize the next dose up.  Please let me know within 2-3 weeks if successful, and ready to dose increase to 27m79meekly, we can do 30 day supply again and in future once we get to 7.5 or 10 we can do 90 day  [email or web address].[email or web address]/savings-resources#savings  This manufacturer discount savings copay card - will be used for the NEXT one the 27mg43mPlease schedule a Follow-up Appointment to: Return in about 3 months (around 09/22/2022) for 3 month DM A1c.  If you have any other questions or concerns, please feel free to call the office or send a message through [place] may also schedule an earlier appointment if necessary.  Additionally, you may be receiving a survey about your experience at our office within a few days to 1 week by e-mail or mail. We value your feedback.  AlexNobie Putnam [place]

## 2022-06-24 NOTE — Assessment & Plan Note (Signed)
Well-controlled HYPERTENSION Seems improved with better pain control now s/p hip / neck surgery No known complications  Off Amlodipine   Plan:  1.  Continue current BP regimen Losartan 118m daily 2. Encourage improved lifestyle - low sodium diet, regular exercise 3. Continue monitor BP outside office, bring readings to next visit, if persistently >140/90 or new symptoms notify office sooner

## 2022-06-24 NOTE — Assessment & Plan Note (Addendum)
Underlying etiology Known OA/DJD multiple joints Worse with repetitive activities S/p R Total Hip Arthroplasty with improvement.  Re order Meloxicam 49m daily AS NEEDED, caution use nsaid

## 2022-06-24 NOTE — Assessment & Plan Note (Signed)
New dx diabetes type 2, A1c 7.7 prior elevated as well. Inadequate response on lifestyle and metformin management Complications - Hyperglycemia, hyperlipidemia, obesity  Plan:  1. May continue Metformin for now, we may pause in future if side effect too much 2. Order rx Mounjaro 2.53m weekly inj x 4 doses, voucher for free med given - he will notify me in 2-3 weeks if doing well on Mounjaro and I can order 548mweekly inj 30 day, should be preferred medication through insurance, can use manufacturer copay card for discount 2. Encourage improved lifestyle - low carb, low sugar diet, reduce portion size, continue improving regular exercise 3.  May Check CBG , bring log to next visit for review 4. Continue ARB, future Statin  3 month A1c

## 2022-06-24 NOTE — Assessment & Plan Note (Signed)
S/p cervical fusion surgery Per Dr Izora Ribas Improved function now Still some residual numbness into R fingers but overall improved He is working to maintain his strength

## 2022-06-25 ENCOUNTER — Ambulatory Visit: Payer: BC Managed Care – PPO | Admitting: Dermatology

## 2022-07-02 ENCOUNTER — Other Ambulatory Visit: Payer: Self-pay | Admitting: Family Medicine

## 2022-07-02 DIAGNOSIS — R7303 Prediabetes: Secondary | ICD-10-CM

## 2022-07-02 NOTE — Telephone Encounter (Signed)
Medication no longer on current medication list Requested Prescriptions  Pending Prescriptions Disp Refills   metFORMIN (GLUCOPHAGE-XR) 500 MG 24 hr tablet [Pharmacy Med Name: METFORMIN HCL ER 500 MG TABLET] 90 tablet 1    Sig: TAKE 1 TABLET BY [place]     Endocrinology:  Diabetes - Biguanides Failed - 07/02/2022  1:39 AM      Failed - B12 Level in normal range and within 720 days    Vitamin B-12  Date Value Ref Range Status  05/26/2012 264 211 - 911 pg/mL Final         Failed - CBC within normal limits and completed in the last 12 months    WBC  Date Value Ref Range Status  01/06/2022 6.6 4.0 - 10.5 K/uL Final   RBC  Date Value Ref Range Status  01/06/2022 5.26 4.22 - 5.81 MIL/uL Final   Hemoglobin  Date Value Ref Range Status  01/06/2022 14.0 13.0 - 17.0 g/dL Final   HCT  Date Value Ref Range Status  01/06/2022 42.0 39.0 - 52.0 % Final   MCHC  Date Value Ref Range Status  01/06/2022 33.3 30.0 - 36.0 g/dL Final   [facility]  Date Value Ref Range Status  01/06/2022 26.6 26.0 - 34.0 pg Final   MCV  Date Value Ref Range Status  01/06/2022 79.8 (L) 80.0 - 100.0 fL Final   No results found for: "PLTCOUNTKUC", "LABPLAT", "POCPLA" RDW  Date Value Ref Range Status  01/06/2022 14.0 11.5 - 15.5 % Final         Passed - Cr in normal range and within 360 days    Creat  Date Value Ref Range Status  10/10/2020 0.93 0.70 - 1.33 mg/dL Final    Comment:    For patients >48 years of age, the reference limit for Creatinine is approximately 13% higher for people identified as African-American. .    Creatinine, Ser  Date Value Ref Range Status  01/06/2022 0.83 0.61 - 1.24 mg/dL Final         Passed - HBA1C is between 0 and 7.9 and within 180 days    Hemoglobin A1C  Date Value Ref Range Status  06/24/2022 7.7 (A) 4.0 - 5.6 % Final   Hgb A1c MFr Bld  Date Value Ref Range Status  10/10/2020 6.2 (H) <5.7 % of total Hgb Final    Comment:    For someone  without known diabetes, a hemoglobin  A1c value between 5.7% and 6.4% is consistent with prediabetes and should be confirmed with a  follow-up test. . For someone with known diabetes, a value <7% indicates that their diabetes is well controlled. A1c targets should be individualized based on duration of diabetes, age, comorbid conditions, and other considerations. . This assay result is consistent with an increased risk of diabetes. . Currently, no consensus exists regarding use of hemoglobin A1c for diagnosis of diabetes for children. .          Passed - eGFR in normal range and within 360 days    GFR, Est African American  Date Value Ref Range Status  10/10/2020 109 > OR = 60 mL/min/1.82m Final   GFR, Est Non African American  Date Value Ref Range Status  10/10/2020 94 > OR = 60 mL/min/1.767mFinal   GFR, Estimated  Date Value Ref Range Status  01/06/2022 >60 >60 mL/min Final    Comment:    (NOTE) Calculated using the CKD-EPI Creatinine Equation (2021)  GFR  Date Value Ref Range Status  10/03/2011 94.25 >60.00 mL/min Final         Passed - Valid encounter within last 6 months    Recent Outpatient Visits           1 week ago Type 2 diabetes mellitus with other specified complication, without long-term current use of insulin [facility])   [place], DO   8 months ago [place], DO   1 year ago [place], [place], DO   1 year ago [facility] Olin Hauser, DO   1 year ago Annual physical exam   [facility] Olin Hauser, DO       Future Appointments             In 1 week Ralene Bathe, MD [place]   In 2 months Parks [place], Devonne Doughty, [facility], [facility]

## 2022-07-09 ENCOUNTER — Encounter: Payer: Self-pay | Admitting: Family Medicine

## 2022-07-09 ENCOUNTER — Ambulatory Visit (INDEPENDENT_AMBULATORY_CARE_PROVIDER_SITE_OTHER): Payer: BC Managed Care – PPO | Admitting: Dermatology

## 2022-07-09 VITALS — BP 158/100 | HR 87

## 2022-07-09 DIAGNOSIS — L821 Other seborrheic keratosis: Secondary | ICD-10-CM

## 2022-07-09 DIAGNOSIS — Z1283 Encounter for screening for malignant neoplasm of skin: Secondary | ICD-10-CM

## 2022-07-09 DIAGNOSIS — L820 Inflamed seborrheic keratosis: Secondary | ICD-10-CM

## 2022-07-09 DIAGNOSIS — L905 Scar conditions and fibrosis of skin: Secondary | ICD-10-CM

## 2022-07-09 DIAGNOSIS — L578 Other skin changes due to chronic exposure to nonionizing radiation: Secondary | ICD-10-CM

## 2022-07-09 DIAGNOSIS — E1169 Type 2 diabetes mellitus with other specified complication: Secondary | ICD-10-CM

## 2022-07-09 DIAGNOSIS — D225 Melanocytic nevi of trunk: Secondary | ICD-10-CM

## 2022-07-09 DIAGNOSIS — L814 Other melanin hyperpigmentation: Secondary | ICD-10-CM

## 2022-07-09 DIAGNOSIS — Z86018 Personal history of other benign neoplasm: Secondary | ICD-10-CM | POA: Diagnosis not present

## 2022-07-09 DIAGNOSIS — D229 Melanocytic nevi, unspecified: Secondary | ICD-10-CM

## 2022-07-09 MED ORDER — MOUNJARO 5 MG/0.5ML ~~LOC~~ SOAJ: 5.0000 mg | SUBCUTANEOUS | 0 refills | Status: DC

## 2022-07-09 NOTE — Patient Instructions (Addendum)
Next appointment 07/08/23 at 3:30   Cryotherapy Aftercare  Wash gently with soap and water everyday.   Apply Vaseline and Band-Aid daily until healed.    Due to recent changes in healthcare laws, you may see results of your pathology and/or laboratory studies on MyChart before the doctors have had a chance to review them. We understand that in some cases there may be results that are confusing or concerning to you. Please understand that not all results are received at the same time and often the doctors may need to interpret multiple results in order to provide you with the best plan of care or course of treatment. Therefore, we ask that you please give [place] 2 business days to thoroughly review all your results before contacting the office for clarification. Should we see a critical lab result, you will be contacted sooner.   If You Need Anything After Your Visit  If you have any questions or concerns for your doctor, please call our main line at [phone] and press option 4 to reach your doctor's medical assistant. If no one answers, please leave a voicemail as directed and we will return your call as soon as possible. Messages left after 4 pm will be answered the following business day.   You may also send [place] a message via [place]. We typically respond to MyChart messages within 1-2 business days.  For prescription refills, please ask your pharmacy to contact our office. Our fax number is [phone].  If you have an urgent issue when the clinic is closed that cannot wait until the next business day, you can page your doctor at the number below.    Please note that while we do our best to be available for urgent issues outside of office hours, we are not available 24/7.   If you have an urgent issue and are unable to reach [place], you may choose to seek medical care at your doctor's office, retail clinic, urgent care center, or emergency room.  If you have a medical emergency, please  immediately call 911 or go to the emergency department.  Pager Numbers  - Dr. Nehemiah Massed: [phone]  - Dr. Laurence Ferrari: [phone]  - Dr. Nicole Kindred: [phone]  In the event of inclement weather, please call our main line at [phone] for an update on the status of any delays or closures.  Dermatology Medication Tips: Please keep the boxes that topical medications come in in order to help keep track of the instructions about where and how to use these. Pharmacies typically print the medication instructions only on the boxes and not directly on the medication tubes.   If your medication is too expensive, please contact our office at [phone] option 4 or send [place] a message through [place].   We are unable to tell what your co-pay for medications will be in advance as this is different depending on your insurance coverage. However, we may be able to find a substitute medication at lower cost or fill out paperwork to get insurance to cover a needed medication.   If a prior authorization is required to get your medication covered by your insurance company, please allow [place] 1-2 business days to complete this process.  Drug prices often vary depending on where the prescription is filled and some pharmacies may offer cheaper prices.  The website www.goodrx.com contains coupons for medications through different pharmacies. The prices here do not account for what the cost may be with help from insurance (it may be cheaper with  your insurance), but the website can give you the price if you did not use any insurance.  - You can print the associated coupon and take it with your prescription to the pharmacy.  - You may also stop by our office during regular business hours and pick up a GoodRx coupon card.  - If you need your prescription sent electronically to a different pharmacy, notify our office through [facility] or by phone at [phone] option 4.     Si Usted Necesita Algo Despus  de Su Visita  Tambin puede enviarnos un mensaje a travs de Pharmacist, community. Por lo general respondemos a los mensajes de MyChart en el transcurso de 1 a 2 das hbiles.  Para renovar recetas, por favor pida a su farmacia que se ponga en contacto con nuestra oficina. Harland Dingwall de fax es [place] [phone].  Si tiene un asunto urgente cuando la clnica est cerrada y que no puede esperar hasta el siguiente da hbil, puede llamar/localizar a su doctor(a) al nmero que aparece a continuacin.   Por favor, tenga en cuenta que aunque hacemos todo lo posible para estar disponibles para asuntos urgentes fuera del horario de [place], no estamos disponibles las 24 horas del da, los 7 das de la [place].   Si tiene un problema urgente y no puede comunicarse con nosotros, puede optar por buscar atencin mdica  en el consultorio de su doctor(a), en una clnica privada, en un centro de atencin urgente o en una sala de emergencias.  Si tiene Engineering geologist, por favor llame inmediatamente al 911 o vaya a la sala de emergencias.  Nmeros de bper  - Dr. Nehemiah Massed: [phone]  - Dra. Moye: [phone]  - Dra. Nicole Kindred: [phone]  En caso de inclemencias del [place], por favor llame a Johnsie Kindred principal al [phone] para una actualizacin sobre el [place] de cualquier retraso o cierre.  Consejos para la medicacin en dermatologa: Por favor, guarde las cajas en las que vienen los medicamentos de uso tpico para ayudarle a seguir las instrucciones sobre dnde y cmo usarlos. Las farmacias generalmente imprimen las instrucciones del medicamento slo en las cajas y no directamente en los tubos del [place].   Si su medicamento es muy caro, por favor, pngase en contacto con Zigmund Daniel llamando al [phone] y presione la opcin 4 o envenos un mensaje a travs de Pharmacist, community.   No podemos decirle cul ser su copago por los medicamentos por adelantado ya que esto es diferente dependiendo  de la cobertura de su seguro. Sin embargo, es posible que podamos encontrar un medicamento sustituto a Electrical engineer un formulario para que el seguro cubra el medicamento que se considera necesario.   Si se requiere una autorizacin previa para que su compaa de seguros [place] su medicamento, por favor permtanos de 1 a 2 das hbiles para completar este proceso.  Los precios de los medicamentos varan con frecuencia dependiendo del Environmental consultant de dnde se surte la receta y alguna farmacias pueden ofrecer precios ms baratos.  El sitio web www.goodrx.com tiene cupones para medicamentos de Airline pilot. Los precios aqu no tienen en cuenta lo que podra costar con la ayuda del seguro (puede ser ms barato con su seguro), pero el sitio web puede darle el precio si no utiliz Research scientist (physical sciences).  - Puede imprimir el cupn correspondiente y llevarlo con su receta a la farmacia.  - Tambin puede pasar por nuestra oficina durante el horario de atencin regular y Corporate treasurer tarjeta  de cupones de GoodRx.  - Si necesita que su receta se enve electrnicamente a una farmacia diferente, informe a nuestra oficina a travs de MyChart de [place] o por telfono llamando al [phone] y presione la opcin 4.

## 2022-07-09 NOTE — Progress Notes (Signed)
Follow-Up Visit   Subjective  Michael Taylor is a 52 y.o. male who presents for the following: Total body skin exam (Check spot back/Tag upper eyelid, gets in way of vision). The patient presents for Total-Body Skin Exam (TBSE) for skin cancer screening and mole check.  The patient has spots, moles and lesions to be evaluated, some may be new or changing and the patient has concerns that these could be cancer.   The following portions of the chart were reviewed this encounter and updated as appropriate:   Tobacco  Allergies  Meds  Problems  Med Hx  Surg Hx  Fam Hx     Review of Systems:  No other skin or systemic complaints except as noted in HPI or Assessment and Plan.  Objective  Well appearing patient in no apparent distress; mood and affect are within normal limits.  A full examination was performed including scalp, head, eyes, ears, nose, lips, neck, chest, axillae, abdomen, back, buttocks, bilateral upper extremities, bilateral lower extremities, hands, feet, fingers, toes, fingernails, and toenails. All findings within normal limits unless otherwise noted below.  low back spinal Hypertrophic scar  back x 25, R mandible x 1 (26) Stuck on waxy paps with erythema   Assessment & Plan   Lentigines - Scattered tan macules - Due to sun exposure - Benign-appearing, observe - Recommend daily broad spectrum sunscreen SPF 30+ to sun-exposed areas, reapply every 2 hours as needed. - Call for any changes - back  Seborrheic Keratoses - Stuck-on, waxy, tan-brown papules and/or plaques  - Benign-appearing - Discussed benign etiology and prognosis. - Observe - Call for any changes - trunk  Melanocytic Nevi - Tan-brown and/or pink-flesh-colored symmetric macules and papules - Benign appearing on exam today - Observation - Call clinic for new or changing moles - Recommend daily use of broad spectrum spf 30+ sunscreen to sun-exposed areas.  - trunk  Hemangiomas -  Red papules - Discussed benign nature - Observe - Call for any changes - trunk  Actinic Damage - Chronic condition, secondary to cumulative UV/sun exposure - diffuse scaly erythematous macules with underlying dyspigmentation - Recommend daily broad spectrum sunscreen SPF 30+ to sun-exposed areas, reapply every 2 hours as needed.  - Staying in the shade or wearing long sleeves, sun glasses (UVA+UVB protection) and wide brim hats (4-inch brim around the entire circumference of the hat) are also recommended for sun protection.  - Call for new or changing lesions.  Skin cancer screening performed today.   Seborrheic keratosis, inflamed (2) R upper eyelid margin x 2 Symptomatic, irritating, patient would like treated. Destruction of lesion - R upper eyelid margin x 2 Complexity: simple   Destruction method: cryotherapy   Informed consent: discussed and consent obtained   Timeout:  patient name, date of birth, surgical site, and procedure verified Lesion destroyed using liquid nitrogen: Yes   Region frozen until ice ball extended beyond lesion: Yes   Outcome: patient tolerated procedure well with no complications   Post-procedure details: wound care instructions given    Scar low back spinal Secondary from dysplastic nevus shave removal  Inflamed seborrheic keratosis (26) back x 25, R mandible x 1 Symptomatic, irritating, patient would like treated. Destruction of lesion - back x 25, R mandible x 1 Complexity: simple   Destruction method: cryotherapy   Informed consent: discussed and consent obtained   Timeout:  patient name, date of birth, surgical site, and procedure verified Lesion destroyed using liquid nitrogen: Yes  Region frozen until ice ball extended beyond lesion: Yes   Outcome: patient tolerated procedure well with no complications   Post-procedure details: wound care instructions given    History of Dysplastic Nevi - No evidence of recurrence today - Recommend  regular full body skin exams - Recommend daily broad spectrum sunscreen SPF 30+ to sun-exposed areas, reapply every 2 hours as needed.  - Call if any new or changing lesions are noted between office visits  - multiple  Return in about 1 year (around 07/10/2023) for TBSE, Hx of Dysplastic nevi.  I, Othelia Pulling, RMA, am acting as scribe for Sarina Ser, MD . Documentation: I have reviewed the above documentation for accuracy and completeness, and I agree with the above.  Sarina Ser, MD

## 2022-07-16 ENCOUNTER — Encounter: Payer: Self-pay | Admitting: Dermatology

## 2022-07-23 ENCOUNTER — Other Ambulatory Visit: Payer: Self-pay | Admitting: Family Medicine

## 2022-07-23 DIAGNOSIS — I10 Essential (primary) hypertension: Secondary | ICD-10-CM

## 2022-07-23 NOTE — Telephone Encounter (Signed)
Requested Prescriptions  Pending Prescriptions Disp Refills   losartan (COZAAR) 100 MG tablet [Pharmacy Med Name: LOSARTAN POTASSIUM 100 MG TAB] 90 tablet 0    Sig: TAKE 1 TABLET BY MOUTH EVERY DAY     Cardiovascular:  Angiotensin Receptor Blockers Failed - 07/23/2022  1:58 AM      Failed - Cr in normal range and within 180 days    Creat  Date Value Ref Range Status  10/10/2020 0.93 0.70 - 1.33 mg/dL Final    Comment:    For patients >54 years of age, the reference limit for Creatinine is approximately 13% higher for people identified as African-American. .    Creatinine, Ser  Date Value Ref Range Status  01/06/2022 0.83 0.61 - 1.24 mg/dL Final         Failed - K in normal range and within 180 days    Potassium  Date Value Ref Range Status  01/06/2022 3.9 3.5 - 5.1 mmol/L Final         Failed - Last BP in normal range    BP Readings from Last 1 Encounters:  07/09/22 (!) 158/100         Passed - Patient is not pregnant      Passed - Valid encounter within last 6 months    Recent Outpatient Visits           4 weeks ago Type 2 diabetes mellitus with other specified complication, without long-term current use of insulin [facility])   [facility] Olin Hauser, DO   9 months ago [place], DO   1 year ago [place], [place], DO   1 year ago [facility] Olin Hauser, DO   1 year ago Annual physical exam   [facility] Olin Hauser, DO       Future Appointments             In 2 months [place], Devonne Doughty, [facility], [place]   In 11 months Ralene Bathe, MD [place]

## 2022-08-15 ENCOUNTER — Encounter: Payer: Self-pay | Admitting: Family Medicine

## 2022-08-15 DIAGNOSIS — E1169 Type 2 diabetes mellitus with other specified complication: Secondary | ICD-10-CM

## 2022-08-15 MED ORDER — MOUNJARO 7.5 MG/0.5ML ~~LOC~~ SOAJ: 7.5000 mg | SUBCUTANEOUS | 0 refills | Status: DC

## 2022-09-03 ENCOUNTER — Other Ambulatory Visit: Payer: Self-pay | Admitting: Family Medicine

## 2022-09-03 DIAGNOSIS — I10 Essential (primary) hypertension: Secondary | ICD-10-CM

## 2022-09-03 NOTE — Telephone Encounter (Signed)
Unable to refill per protocol, Rx expired. Discontinued 06/24/22.  Requested Prescriptions  Pending Prescriptions Disp Refills   amLODipine (NORVASC) 10 MG tablet [Pharmacy Med Name: AMLODIPINE BESYLATE 10 MG TAB] 90 tablet 0    Sig: TAKE 1 TABLET BY MOUTH EVERY DAY     Cardiovascular: Calcium Channel Blockers 2 Failed - 09/03/2022  1:45 AM      Failed - Last BP in normal range    BP Readings from Last 1 Encounters:  07/09/22 (!) 158/100         Passed - Last Heart Rate in normal range    Pulse Readings from Last 1 Encounters:  07/09/22 87         Passed - Valid encounter within last 6 months    Recent Outpatient Visits           2 months ago Type 2 diabetes mellitus with other specified complication, without long-term current use of insulin [facility])   [place] [facility] [place], Netta Neat, DO   10 months ago Pre-diabetes   [place] [facility] Althea Charon, Netta Neat, DO   1 year ago Pre-diabetes   [place] [facility] Smitty Cords, DO   1 year ago Pre-diabetes   [place] [facility] Smitty Cords, DO   1 year ago Annual physical exam   [place] [facility] Smitty Cords, DO       Future Appointments             In 2 weeks Althea Charon, Netta Neat, DO  [facility], [place]   In 10 months Deirdre Evener, MD [facility]

## 2022-09-11 MED ORDER — MOUNJARO 10 MG/0.5ML ~~LOC~~ SOAJ: 10.0000 mg | SUBCUTANEOUS | 2 refills | Status: DC

## 2022-09-11 NOTE — Addendum Note (Signed)
Addended by: Smitty Cords on: 09/11/2022 05:13 PM   Modules accepted: Orders

## 2022-09-22 ENCOUNTER — Ambulatory Visit: Payer: BC Managed Care – PPO | Admitting: Family Medicine

## 2022-09-22 ENCOUNTER — Encounter: Payer: Self-pay | Admitting: Family Medicine

## 2022-09-22 VITALS — BP 126/80 | HR 82 | Ht 69.0 in | Wt 260.0 lb

## 2022-09-22 DIAGNOSIS — I10 Essential (primary) hypertension: Secondary | ICD-10-CM | POA: Diagnosis not present

## 2022-09-22 DIAGNOSIS — E1169 Type 2 diabetes mellitus with other specified complication: Secondary | ICD-10-CM

## 2022-09-22 LAB — POCT GLYCOSYLATED HEMOGLOBIN (HGB A1C): Hemoglobin A1C: 6 % — AB (ref 4.0–5.6)

## 2022-09-22 NOTE — Patient Instructions (Addendum)
Thank you for coming to the office today.  Recent Labs    10/16/21 1517 06/24/22 1623 09/22/22 1634  HGBA1C 6.3* 7.7* 6.0*   Your provider would like to you have your annual eye exam. Please contact your current eye doctor or here are some good options for you to contact.   [facility]   Address: [address] [place], [place] [address] Phone: [phone]  Website: [email or web address]   [facility] [address] [place], [place] [address] Phone: [phone] [email or web address]  [facility]  Address: [address] [place], [place], [place] [address] Phone: [phone]   [facility] [address] [place], [place] [address] Phone: [phone]  [facility] Address: [address] [place], [place], [place] [address]  Phone: [phone]   Future check urine test for kidney health   Please schedule a Follow-up Appointment to: Return in about 3 months (around 12/23/2022) for 3 month fasting lab only then 1 week later Annual Physical.  If you have any other questions or concerns, please feel free to call the office or send a message through MyChart. You may also schedule an earlier appointment if necessary.  Additionally, you may be receiving a survey about your experience at our office within a few days to 1 week by e-mail or mail. We value your feedback.  Saralyn Pilar, DO [place], [place]

## 2022-09-22 NOTE — Progress Notes (Unsigned)
Subjective:    Patient ID: Michael Taylor, male    DOB: 13-Jan-1971, 52 y.o.   MRN: [id number]  Michael Taylor is a 52 y.o. male presenting on 09/22/2022 for Diabetes   HPI  CHRONIC HTN / Morbid Obesity BMI >41 See prior notes for background information. Improved BP. Off Amlodipine Seems BP improved with pain in hip resolved Current Meds - Losartan 100mg  daily Adhering to medication better now Tolerating well, w/o complaints. Denies CP, dyspnea, HA, edema, dizziness / lightheadedness   Type 2 Diabetes, new diagnosis A1c prior elevated 6 range, highest 7 Today A1c elevated again 7.7 Previously on Metformin 500 BID for PreDM but he had loose stool side effect, now off med Interested in other medication options for DIABETES and sugar control.   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 Dramatic improvement after replacement Mobility improved Shoulders still a bit weak, he is working on R triceps muscle and strength.  Prior A1c 7.7, now down to 6.0 Moving well now with hip Wt down 21 lbs in 3 months Improved on Mounjaro up to 10mg  weekly inj Some mild slower bowels and mild constipation not every day      09/22/2022    4:11 PM 10/16/2021    5:27 PM 10/16/2020    2:45 PM  Depression screen PHQ 2/9  Decreased Interest 0 0 0  Down, Depressed, Hopeless 0 0 0  PHQ - 2 Score 0 0 0  Altered sleeping   0  Tired, decreased energy   0  Change in appetite   0  Feeling bad or failure about yourself    0  Trouble concentrating   0  Moving slowly or fidgety/restless   0  Suicidal thoughts   0  PHQ-9 Score   0  Difficult doing work/chores   Not difficult at all    Social History    Tobacco Use   Smoking status: Never   Smokeless tobacco: Never  Vaping Use   Vaping Use: Never used  Substance Use Topics   Alcohol use: Yes    Alcohol/week: 4.0 standard drinks of alcohol    Types: 4 Shots of liquor per week    Comment: rare, < 1 x monthly   Drug use: No    Review of Systems Per HPI unless specifically indicated above     Objective:    BP 126/80   Pulse 82   Ht 5\' 9"  (1.753 m)   Wt 260 lb (117.9 kg)   SpO2 97%   BMI 38.40 kg/m   Wt Readings from Last 3 Encounters:  09/22/22 260 lb (117.9 kg)  06/24/22 281 lb (127.5 kg)  04/08/22 275 lb 6.4 oz (124.9 kg)    Physical Exam Vitals and nursing note reviewed.  Constitutional:      General: He is not in acute distress.    Appearance: He is well-developed. He is obese. He is not diaphoretic.     Comments: Well-appearing, comfortable, cooperative  HENT:     Head: Normocephalic and atraumatic.  Eyes:     General:        Right eye: No discharge.        Left eye: No  discharge.     Conjunctiva/sclera: Conjunctivae normal.  Neck:     Thyroid: No thyromegaly.  Cardiovascular:     Rate and Rhythm: Normal rate and regular rhythm.     Pulses: Normal pulses.     Heart sounds: Normal heart sounds. No murmur heard. Pulmonary:     [place]: Pulmonary effort is normal. No respiratory distress.     Breath sounds: Normal breath sounds. No wheezing or rales.  Musculoskeletal:        General: Normal range of motion.     Cervical back: Normal range of motion and neck supple.  Lymphadenopathy:     Cervical: No cervical adenopathy.  Skin:    General: Skin is warm and dry.     Findings: No erythema or rash.  Neurological:     Mental Status: He is alert and oriented to person, place, and time. Mental status is at baseline.  Psychiatric:        Behavior: Behavior normal.     Comments: Well groomed, good eye contact, normal speech and thoughts       Diabetic Foot Exam - Simple   Simple Foot Form Diabetic  Foot exam was performed with the following findings: Yes 09/22/2022  4:47 PM  Visual Inspection No deformities, no ulcerations, no other skin breakdown bilaterally: Yes Sensation Testing Intact to touch and monofilament testing bilaterally: Yes Pulse Check Posterior Tibialis and Dorsalis pulse intact bilaterally: Yes Comments      Recent Labs    10/16/21 1517 06/24/22 1623 09/22/22 1634  HGBA1C 6.3* 7.7* 6.0*     Results for orders placed or performed in visit on 09/22/22  POCT HgB A1C  Result Value Ref Range   Hemoglobin A1C 6.0 (A) 4.0 - 5.6 %   HbA1c POC (<> result, manual entry)     HbA1c, POC (prediabetic range)     HbA1c, POC (controlled diabetic range)        Assessment & Plan:   Problem List Items Addressed This Visit     Essential hypertension    Well-controlled HYPERTENSION Seems improved with better pain control now s/p hip / neck surgery No known complications  Off Amlodipine   Plan:  1.  Continue current BP regimen Losartan 100mg  daily 2. Encourage improved lifestyle - low sodium diet, regular exercise 3. Continue monitor BP outside office, bring readings to next visit, if persistently >140/90 or new symptoms notify office sooner      Type 2 diabetes mellitus with other specified complication (HCC) - Primary    Improved A1c to 6.0 on GLP GIP WT Loss Complications - Hyperglycemia, hyperlipidemia, obesity  Plan:  1. Continue Mounjaro 10mg  weekly Off Metformin 2. Encourage improved lifestyle - low carb, low sugar diet, reduce portion size, continue improving regular exercise 3.  May Check CBG , bring log to next visit for review 4. Continue ARB, future Statin DM Foot Exam, request DM Eye Exam, future Urine microalbumin      Relevant Orders   POCT HgB A1C (Completed)       No orders of the defined types were placed in this encounter.     Follow up plan: Return in about 3 months (around 12/23/2022) for 3 month fasting lab only then 1 week  later Annual Physical.  Future labs ordered for 12/23/22   Saralyn Pilar, DO [facility] 09/22/2022, 4:35 PM

## 2022-09-23 ENCOUNTER — Other Ambulatory Visit: Payer: Self-pay | Admitting: Family Medicine

## 2022-09-23 ENCOUNTER — Encounter: Payer: Self-pay | Admitting: Family Medicine

## 2022-09-23 DIAGNOSIS — R7303 Prediabetes: Secondary | ICD-10-CM

## 2022-09-23 DIAGNOSIS — Z0000 Encounter for general adult medical examination without abnormal findings: Secondary | ICD-10-CM

## 2022-09-23 DIAGNOSIS — Z125 Encounter for screening for malignant neoplasm of prostate: Secondary | ICD-10-CM

## 2022-09-23 DIAGNOSIS — E782 Mixed hyperlipidemia: Secondary | ICD-10-CM

## 2022-09-23 DIAGNOSIS — E1169 Type 2 diabetes mellitus with other specified complication: Secondary | ICD-10-CM

## 2022-09-23 DIAGNOSIS — I10 Essential (primary) hypertension: Secondary | ICD-10-CM

## 2022-09-23 NOTE — Assessment & Plan Note (Signed)
Improved A1c to 6.0 on GLP GIP WT Loss Complications - Hyperglycemia, hyperlipidemia, obesity  Plan:  1. Continue Mounjaro 10mg  weekly Off Metformin 2. Encourage improved lifestyle - low carb, low sugar diet, reduce portion size, continue improving regular exercise 3.  May Check CBG , bring log to next visit for review 4. Continue ARB, future Statin DM Foot Exam, request DM Eye Exam, future Urine microalbumin

## 2022-09-23 NOTE — Addendum Note (Signed)
Addended by: Smitty Cords on: 09/23/2022 12:18 AM   Modules accepted: Orders

## 2022-09-23 NOTE — Assessment & Plan Note (Signed)
Well-controlled HYPERTENSION Seems improved with better pain control now s/p hip / neck surgery No known complications  Off Amlodipine   Plan:  1.  Continue current BP regimen Losartan 100mg daily 2. Encourage improved lifestyle - low sodium diet, regular exercise 3. Continue monitor BP outside office, bring readings to next visit, if persistently >140/90 or new symptoms notify office sooner 

## 2022-10-02 ENCOUNTER — Other Ambulatory Visit: Payer: Self-pay

## 2022-10-02 DIAGNOSIS — G959 Disease of spinal cord, unspecified: Secondary | ICD-10-CM

## 2022-10-07 ENCOUNTER — Ambulatory Visit: Payer: Self-pay | Admitting: Neurosurgery

## 2022-10-30 ENCOUNTER — Other Ambulatory Visit: Payer: Self-pay | Admitting: Family Medicine

## 2022-10-30 DIAGNOSIS — I10 Essential (primary) hypertension: Secondary | ICD-10-CM

## 2022-10-30 NOTE — Telephone Encounter (Signed)
Requested Prescriptions  Pending Prescriptions Disp Refills   losartan (COZAAR) 100 MG tablet [Pharmacy Med Name: LOSARTAN POTASSIUM 100 MG TAB] 90 tablet 1    Sig: TAKE 1 TABLET BY MOUTH EVERY DAY     Cardiovascular:  Angiotensin Receptor Blockers Failed - 10/30/2022  2:36 AM      Failed - Cr in normal range and within 180 days    Creat  Date Value Ref Range Status  10/10/2020 0.93 0.70 - 1.33 mg/dL Final    Comment:    For patients >54 years of age, the reference limit for Creatinine is approximately 13% higher for people identified as African-American. .    Creatinine, Ser  Date Value Ref Range Status  01/06/2022 0.83 0.61 - 1.24 mg/dL Final         Failed - K in normal range and within 180 days    Potassium  Date Value Ref Range Status  01/06/2022 3.9 3.5 - 5.1 mmol/L Final         Passed - Patient is not pregnant      Passed - Last BP in normal range    BP Readings from Last 1 Encounters:  09/22/22 126/80         Passed - Valid encounter within last 6 months    Recent Outpatient Visits           1 month ago Type 2 diabetes mellitus with other specified complication, without long-term current use of insulin [facility])   [place] [facility] [place], Netta Neat, DO   4 months ago Type 2 diabetes mellitus with other specified complication, without long-term current use of insulin [facility])   [place] [facility] [place], Netta Neat, DO   1 year ago Pre-diabetes   [facility] Smitty Cords, DO   1 year ago Pre-diabetes   [place] [facility] Smitty Cords, DO   1 year ago Pre-diabetes   [place] [facility] Althea Charon, Netta Neat, DO       Future Appointments             In 2 months Althea Charon, Netta Neat, DO [facility], [place]   In 8 months Deirdre Evener, MD [facility]

## 2022-12-09 ENCOUNTER — Encounter: Payer: Self-pay | Admitting: Family Medicine

## 2022-12-09 DIAGNOSIS — E1169 Type 2 diabetes mellitus with other specified complication: Secondary | ICD-10-CM

## 2022-12-09 MED ORDER — MOUNJARO 12.5 MG/0.5ML ~~LOC~~ SOAJ
12.5000 mg | SUBCUTANEOUS | 2 refills | Status: DC
Start: 2022-12-09 — End: 2022-12-16

## 2022-12-16 ENCOUNTER — Telehealth: Payer: Self-pay

## 2022-12-16 ENCOUNTER — Other Ambulatory Visit: Payer: Self-pay | Admitting: Internal Medicine

## 2022-12-16 MED ORDER — TIRZEPATIDE 7.5 MG/0.5ML ~~LOC~~ SOAJ: 7.5000 mg | SUBCUTANEOUS | 0 refills | Status: DC

## 2022-12-16 NOTE — Telephone Encounter (Signed)
Pt reports that [facility] does not have Mounjaro 10mg  or 12.5mg  in stock at this time.  Is it possible for him to go back to the 7.5mg  until the other comes back in?     Thanks,   -Vernona Rieger

## 2022-12-16 NOTE — Telephone Encounter (Signed)
I have sent in the 7.5 mg dose for him

## 2022-12-23 ENCOUNTER — Other Ambulatory Visit: Payer: BC Managed Care – PPO

## 2022-12-23 DIAGNOSIS — E782 Mixed hyperlipidemia: Secondary | ICD-10-CM

## 2022-12-23 DIAGNOSIS — Z125 Encounter for screening for malignant neoplasm of prostate: Secondary | ICD-10-CM

## 2022-12-23 DIAGNOSIS — Z0000 Encounter for general adult medical examination without abnormal findings: Secondary | ICD-10-CM

## 2022-12-23 DIAGNOSIS — R7303 Prediabetes: Secondary | ICD-10-CM

## 2022-12-23 DIAGNOSIS — E1169 Type 2 diabetes mellitus with other specified complication: Secondary | ICD-10-CM

## 2022-12-23 DIAGNOSIS — I10 Essential (primary) hypertension: Secondary | ICD-10-CM

## 2022-12-24 ENCOUNTER — Other Ambulatory Visit: Payer: BC Managed Care – PPO

## 2022-12-29 ENCOUNTER — Encounter: Payer: BC Managed Care – PPO | Admitting: Family Medicine

## 2023-01-20 ENCOUNTER — Ambulatory Visit (INDEPENDENT_AMBULATORY_CARE_PROVIDER_SITE_OTHER): Payer: BC Managed Care – PPO | Admitting: Family Medicine

## 2023-01-20 ENCOUNTER — Encounter: Payer: Self-pay | Admitting: Family Medicine

## 2023-01-20 VITALS — BP 134/80 | HR 70 | Ht 66.73 in | Wt 258.0 lb

## 2023-01-20 DIAGNOSIS — M4802 Spinal stenosis, cervical region: Secondary | ICD-10-CM

## 2023-01-20 DIAGNOSIS — Z0000 Encounter for general adult medical examination without abnormal findings: Secondary | ICD-10-CM | POA: Diagnosis not present

## 2023-01-20 DIAGNOSIS — E1169 Type 2 diabetes mellitus with other specified complication: Secondary | ICD-10-CM

## 2023-01-20 DIAGNOSIS — Z1211 Encounter for screening for malignant neoplasm of colon: Secondary | ICD-10-CM | POA: Diagnosis not present

## 2023-01-20 DIAGNOSIS — I10 Essential (primary) hypertension: Secondary | ICD-10-CM

## 2023-01-20 DIAGNOSIS — M159 Polyosteoarthritis, unspecified: Secondary | ICD-10-CM

## 2023-01-20 MED ORDER — MELOXICAM 15 MG PO TABS
15.0000 mg | ORAL_TABLET | Freq: Every day | ORAL | 1 refills | Status: DC | PRN
Start: 2023-01-20 — End: 2023-09-30

## 2023-01-20 MED ORDER — LOSARTAN POTASSIUM 100 MG PO TABS
100.0000 mg | ORAL_TABLET | Freq: Every day | ORAL | 1 refills | Status: DC
Start: 2023-01-20 — End: 2023-05-14

## 2023-01-20 MED ORDER — MOUNJARO 12.5 MG/0.5ML ~~LOC~~ SOAJ
12.5000 mg | SUBCUTANEOUS | 1 refills | Status: DC
Start: 2023-01-20 — End: 2023-07-28

## 2023-01-20 NOTE — Patient Instructions (Addendum)
Thank you for coming to the office today.  Have them fax [place] a copy of the report Fax [phone]  [facility]   Address: [address], [place] [address] Phone: [phone]  Website: [email or web address]    Recent Labs    06/24/22 1623 09/22/22 1634 12/24/22 0812  HGBA1C 7.7* 6.0* 5.9*    Weight down 23 lbs in 6 months  Re ordered Mounjaro 12.5mg  weekly, for 90 day if they have it.   You can double up on 7.5mg  if needed.  Ordered the Cologuard (home kit) test for colon cancer screening. Stay tuned for further updates.  It will be shipped to you directly. If not received in 2-4 weeks, call us or the company.   If you send it back and no results are received in 2-4 weeks, call us or the company as well!   Colon Cancer Screening: - For all adults age 34+ routine colon cancer screening is highly recommended.     - Recent guidelines from [employer] Society recommend starting age of 33 - Early detection of colon cancer is important, because often there are no warning signs or symptoms, also if found early usually it can be cured. Late stage is hard to treat.   - If Cologuard is NEGATIVE, then it is good for 3 years before next due - If Cologuard is POSITIVE, then it is strongly advised to get a Colonoscopy, which allows the GI doctor to locate the source of the cancer or polyp (even very early stage) and treat it by removing it. ------------------------- Follow instructions to collect sample, you may call the company for any help or questions, 24/7 telephone support at [phone].    Please schedule a Follow-up Appointment to: Return in about 6 months (around 07/20/2023) for 6 month DM A1c, HTN updates.  If you have any other questions or concerns, please feel free to call the office or send a message through MyChart. You may also schedule an earlier appointment if necessary.  Additionally, you may be receiving a survey about your experience at our  office within a few days to 1 week by e-mail or mail. We value your feedback.  Saralyn Pilar, DO [facility], [place]

## 2023-01-20 NOTE — Progress Notes (Signed)
Subjective:    Patient ID: Michael Taylor, male    DOB: 08/29/1970, 52 y.o.   MRN: [id number]  Michael Taylor is a 52 y.o. male presenting on 01/20/2023 for Annual Exam   HPI  Here for Annual Physical   CHRONIC HTN / Morbid Obesity BMI >40  Home BP 120-130s/80s Consider future PSG not endorsing significant OSA symptoms now however,  See prior notes for background information. Improved BP. Off Amlodipine Seems BP improved with pain in hip resolved Current Meds - Losartan 100mg  daily Adhering to medication better now Tolerating well, w/o complaints. 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 Dramatic improvement after replacement Mobility improved Shoulders still a bit weak, he is working on R triceps muscle and strength.   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 Improved LDL 74 Not on statin   Teladoc from his insurance for sugars, weights etc   Health Maintenance: Decline Vaccine today  Due for Cologuard, last done 11/2019, re ordered today  PSA 0.94 (12/2022)     09/22/2022    4:11 PM 10/16/2021    5:27 PM 10/16/2020    2:45 PM  Depression screen PHQ 2/9  Decreased Interest 0 0 0  Down, Depressed, Hopeless 0 0 0  PHQ - 2 Score 0 0 0  Altered sleeping   0  Tired, decreased energy   0  Change in appetite   0  Feeling bad or failure about yourself    0  Trouble concentrating   0  Moving slowly or fidgety/restless   0  Suicidal thoughts    0  PHQ-9 Score   0  Difficult doing work/chores   Not difficult at all    Past Medical History:  Diagnosis Date   Arthritis    Dysplastic nevus 04/26/2015   Left sup. med. scapula. Moderate atypia, margins free.   Dysplastic nevus 04/26/2015   Low back spinal. Mild atypia, deep margin involved.   Dysplastic nevus 04/26/2015   Left posterior side. Mild atypia, lateral margin involved.   Dysplastic nevus 04/26/2015   Left lateral waistline. Mild atypia, lateral and deep margin involved.    Dysplastic nevus 04/27/2019   Right post. shoulder. Moderate atypia, close to margin   Dysplastic nevus 04/27/2019   Left low back paraspinal. Moderate atypia, peripheral margin involved.   Dysplastic nevus 04/27/2019   Right low back paraspinal above sacral. Moderate atypia, margins free.    Hypertension    Neuromuscular disorder (HCC)    numbness right hand   Plantar fasciitis, bilateral    right greater that left,  diagnosed by Dr.  Karl Bales   Pre-diabetes    Past Surgical History:  Procedure Laterality Date   ANTERIOR CERVICAL DECOMP/DISCECTOMY FUSION N/A 01/15/2022   Procedure: C5-7 ANTERIOR CERVICAL DISCECTOMY AND FUSION (GLOBUS HEDRON);  Surgeon: Venetia Night, MD;  Location: [facility] ORS;  Service: Neurosurgery;  Laterality: N/A;   CARPAL TUNNEL RELEASE Bilateral    COLONOSCOPY  HIP SURGERY     right,  [facility] bone graft   MECKEL DIVERTICULUM EXCISION  2011   [facility] Bird   SHOULDER DEBRIDEMENT     x 2   WISDOM TOOTH EXTRACTION     Social History   Socioeconomic History   Marital status: Married    Spouse name: Raynelle Fanning   Number of children: 4   Years of education: [employer] Advice worker)   Highest education level: Not on file  Occupational History   Occupation: Pensions consultant (Engineer, structural)    Comment: Practices locally in [place]  Tobacco Use   Smoking status: Never   Smokeless tobacco: Never  Vaping Use   Vaping status: Never Used  Substance and Sexual Activity   Alcohol use: Yes     Alcohol/week: 4.0 standard drinks of alcohol    Types: 4 Shots of liquor per week    Comment: rare, < 1 x monthly   Drug use: No   Sexual activity: Not on file  Other Topics Concern   Not on file  Social History Narrative   Not on file   Social Determinants of Health   Financial Resource Strain: Not on file  Food Insecurity: Not on file  Transportation Needs: Not on file  Physical Activity: Not on file  Stress: Not on file  Social Connections: Unknown (09/17/2021)   [facility], [facility]   Social Network    Social Network: Not on file  Intimate Partner Violence: Unknown (09/17/2021)   Received from [employer], [facility]   HITS    Physically Hurt: Not on file    Insult or Talk Down To: Not on file    Threaten Physical Harm: Not on file    Scream or Curse: Not on file   Family History  Problem Relation Age of Onset   Arthritis Mother    Hypertension Mother    Arthritis Father        knee replacement   Hypertension Father    Heart disease Father 33   Heart attack Father 67   Prostate cancer Father 84       radiation treatment successful   Colon cancer Neg Hx    Current Outpatient Medications on File Prior to Visit  Medication Sig   ergocalciferol (VITAMIN D2) 1.25 MG (50000 UT) capsule Take 50,000 Units by mouth once a week.   loratadine (CLARITIN) 10 MG tablet Take 10 mg by mouth daily.   Multiple Vitamin (MULTIVITAMIN) tablet Take 1 tablet by mouth daily.   omeprazole (PRILOSEC) 20 MG capsule Take 1 capsule (20 mg total) by mouth daily.   vitamin B-12 (CYANOCOBALAMIN) 100 MCG tablet Take 100 mcg by mouth daily.   No current facility-administered medications on file prior to visit.    Review of Systems Per HPI unless specifically indicated above      Objective:    BP 134/80 (BP Location: Left Arm, Cuff Size: Normal)   Pulse 70   Ht 5' 6.73" (1.695 m)   Wt 258 lb (117 kg)   SpO2 95%   BMI 40.73 kg/m   Wt Readings from Last 3  Encounters:  01/20/23 258 lb (117 kg)  09/22/22 260 lb (117.9 kg)  06/24/22 281 lb (127.5 kg)    Physical Exam    Results for orders placed or performed in visit on 12/23/22  Urine Microalbumin w/creat. ratio  Result Value Ref Range   Creatinine, Urine 169 20 - 320 mg/dL   Microalb, Ur 2.3 mg/dL  Microalb Creat Ratio 14 <30 mg/g creat  TSH  Result Value Ref Range   TSH 1.87 0.40 - 4.50 mIU/L  PSA  Result Value Ref Range   PSA 0.94 < OR = 4.00 ng/mL  Lipid panel  Result Value Ref Range   Cholesterol 137 <200 mg/dL   HDL 38 (L) > OR = 40 mg/dL   Triglycerides 914 (H) <150 mg/dL   LDL Cholesterol (Calc) 74 mg/dL (calc)   Total CHOL/HDL Ratio 3.6 <5.0 (calc)   Non-HDL Cholesterol (Calc) 99 <782 mg/dL (calc)  Hemoglobin N5A  Result Value Ref Range   Hgb A1c MFr Bld 5.9 (H) <5.7 % of total Hgb   Mean Plasma Glucose 123 mg/dL   eAG (mmol/L) 6.8 mmol/L  CBC with Differential/Platelet  Result Value Ref Range   WBC 6.0 3.8 - 10.8 Thousand/uL   RBC 5.59 4.20 - 5.80 Million/uL   Hemoglobin 14.2 13.2 - 17.1 g/dL   HCT 21.3 08.6 - 57.8 %   MCV 80.1 80.0 - 100.0 fL   MCH 25.4 (L) 27.0 - 33.0 pg   MCHC 31.7 (L) 32.0 - 36.0 g/dL   RDW 46.9 (H) 62.9 - 52.8 %   Platelets 259 140 - 400 Thousand/uL   MPV 10.7 7.5 - 12.5 fL   Neutro Abs 3,864 1,500 - 7,800 cells/uL   Lymphs Abs 1,488 850 - 3,900 cells/uL   Absolute Monocytes 438 200 - 950 cells/uL   Eosinophils Absolute 168 15 - 500 cells/uL   Basophils Absolute 42 0 - 200 cells/uL   Neutrophils Relative % 64.4 %   Total Lymphocyte 24.8 %   Monocytes Relative 7.3 %   Eosinophils Relative 2.8 %   Basophils Relative 0.7 %  COMPLETE METABOLIC PANEL WITH GFR  Result Value Ref Range   Glucose, Bld 104 (H) 65 - 99 mg/dL   BUN 14 7 - 25 mg/dL   Creat 4.13 2.44 - 0.10 mg/dL   eGFR 95 > OR = 60 UV/OZD/6.64Q0   BUN/Creatinine Ratio SEE NOTE: 6 - 22 (calc)   Sodium 140 135 - 146 mmol/L   Potassium 4.1 3.5 - 5.3 mmol/L   Chloride 103  98 - 110 mmol/L   CO2 29 20 - 32 mmol/L   Calcium 9.1 8.6 - 10.3 mg/dL   Total Protein 6.6 6.1 - 8.1 g/dL   Albumin 4.0 3.6 - 5.1 g/dL   Globulin 2.6 1.9 - 3.7 g/dL (calc)   AG Ratio 1.5 1.0 - 2.5 (calc)   Total Bilirubin 0.6 0.2 - 1.2 mg/dL   Alkaline phosphatase (APISO) 70 35 - 144 U/L   AST 23 10 - 35 U/L   ALT 25 9 - 46 U/L      Assessment & Plan:   Problem List Items Addressed This Visit     Essential hypertension    Mild elevated BP, improve on repeat Possible undiagnosed OSA contributing - consider future PSG if indicated  Seems improved with better pain control now s/p hip / neck surgery No known complications  Off Amlodipine   Plan:  1.  Continue current BP regimen Losartan 100mg  daily 2. Encourage improved lifestyle - low sodium diet, regular exercise 3. Continue monitor BP outside office, bring readings to next visit, if persistently >140/90 or new symptoms notify office sooner      Relevant Medications   losartan (COZAAR) 100 MG tablet   Morbid obesity (HCC)   Relevant Medications   MOUNJARO 12.5 MG/0.5ML Pen   Neuroforaminal stenosis of  cervical spine   Relevant Medications   meloxicam (MOBIC) 15 MG tablet   Primary osteoarthritis involving multiple joints   Relevant Medications   meloxicam (MOBIC) 15 MG tablet   Type 2 diabetes mellitus with other specified complication (HCC)    A1c to 5.9 WT Loss continued successful Complications - Hyperglycemia, hyperlipidemia, obesity Off Metformin   Plan:  Re ordered Mounjaro 12.5mg  weekly, for 90 day if they have it.  You can double up on 7.5mg  if needed. Encourage improved lifestyle - low carb, low sugar diet, reduce portion size, continue improving regular exercise May Check CBG , bring log to next visit for review Continue ARB, future Statin Due DM Eye Exam      Relevant Medications   MOUNJARO 12.5 MG/0.5ML Pen   losartan (COZAAR) 100 MG tablet   Other Visit Diagnoses     Annual physical exam    -   Primary   Screen for colon cancer       Relevant Orders   Cologuard       Updated Health Maintenance information Reviewed recent lab results with patient Encouraged improvement to lifestyle with diet and exercise Goal of weight loss  Weight down 23 lbs in 6 months   Ordered the Cologuard (home kit) test for colon cancer screening. Stay tuned for further updates.  Orders Placed This Encounter  Procedures   Cologuard     Meds ordered this encounter  Medications   MOUNJARO 12.5 MG/0.5ML Pen    Sig: Inject 12.5 mg into the skin once a week.    Dispense:  6 mL    Refill:  1   losartan (COZAAR) 100 MG tablet    Sig: Take 1 tablet (100 mg total) by mouth daily.    Dispense:  90 tablet    Refill:  1    Keep on file for future refills   meloxicam (MOBIC) 15 MG tablet    Sig: Take 1 tablet (15 mg total) by mouth daily as needed for pain.    Dispense:  90 tablet    Refill:  1    Keep on file for future refills     Follow up plan: Return in about 6 months (around 07/20/2023) for 6 month DM A1c, HTN updates.  Saralyn Pilar, DO [facility] 01/20/2023, 1:35 PM

## 2023-01-20 NOTE — Assessment & Plan Note (Signed)
A1c to 5.9 WT Loss continued successful Complications - Hyperglycemia, hyperlipidemia, obesity Off Metformin   Plan:  Re ordered Mounjaro 12.5mg  weekly, for 90 day if they have it.  You can double up on 7.5mg  if needed. Encourage improved lifestyle - low carb, low sugar diet, reduce portion size, continue improving regular exercise May Check CBG , bring log to next visit for review Continue ARB, future Statin Due DM Eye Exam

## 2023-01-20 NOTE — Assessment & Plan Note (Signed)
Mild elevated BP, improve on repeat Possible undiagnosed OSA contributing - consider future PSG if indicated  Seems improved with better pain control now s/p hip / neck surgery No known complications  Off Amlodipine   Plan:  1.  Continue current BP regimen Losartan 100mg  daily 2. Encourage improved lifestyle - low sodium diet, regular exercise 3. Continue monitor BP outside office, bring readings to next visit, if persistently >140/90 or new symptoms notify office sooner

## 2023-02-16 LAB — COLOGUARD: COLOGUARD: NEGATIVE

## 2023-05-10 ENCOUNTER — Other Ambulatory Visit: Payer: Self-pay | Admitting: Family Medicine

## 2023-05-10 DIAGNOSIS — I10 Essential (primary) hypertension: Secondary | ICD-10-CM

## 2023-05-14 NOTE — Telephone Encounter (Signed)
 Requested Prescriptions  Pending Prescriptions Disp Refills   losartan  (COZAAR ) 100 MG tablet [Pharmacy Med Name: LOSARTAN  POTASSIUM 100 MG TAB] 90 tablet 1    Sig: TAKE 1 TABLET BY MOUTH EVERY DAY     Cardiovascular:  Angiotensin Receptor Blockers Passed - 05/14/2023  3:09 PM      Passed - Cr in normal range and within 180 days    Creat  Date Value Ref Range Status  12/24/2022 0.95 0.70 - 1.30 mg/dL Final   Creatinine, Urine  Date Value Ref Range Status  12/24/2022 169 20 - 320 mg/dL Final         Passed - K in normal range and within 180 days    Potassium  Date Value Ref Range Status  12/24/2022 4.1 3.5 - 5.3 mmol/L Final         Passed - Patient is not pregnant      Passed - Last BP in normal range    BP Readings from Last 1 Encounters:  01/20/23 134/80         Passed - Valid encounter within last 6 months    Recent Outpatient Visits           3 months ago Annual physical exam   [place] [facility] [place] Marsa PARAS, DO   7 months ago Type 2 diabetes mellitus with other specified complication, without long-term current use of insulin [facility])   [place] [facility] [place], Marsa PARAS, DO   10 months ago Type 2 diabetes mellitus with other specified complication, without long-term current use of insulin [facility])   [place] [facility] [place], Marsa PARAS, DO   1 year ago Pre-diabetes   [place] [facility] [place] Marsa PARAS, DO   1 year ago Pre-diabetes   [place] [facility] [place] Marsa PARAS, DO       Future Appointments             In 1 month Hester Alm BROCKS, MD [facility]   In 2 months [place], Marsa PARAS, DO  [facility], [facility]

## 2023-07-08 ENCOUNTER — Ambulatory Visit: Payer: BC Managed Care – PPO | Admitting: Dermatology

## 2023-07-21 ENCOUNTER — Ambulatory Visit: Payer: BC Managed Care – PPO | Admitting: Dermatology

## 2023-07-28 ENCOUNTER — Encounter: Payer: Self-pay | Admitting: Family Medicine

## 2023-07-28 ENCOUNTER — Ambulatory Visit: Payer: BC Managed Care – PPO | Admitting: Family Medicine

## 2023-07-28 ENCOUNTER — Other Ambulatory Visit: Payer: Self-pay | Admitting: Family Medicine

## 2023-07-28 VITALS — BP 124/80 | HR 82 | Ht 66.73 in | Wt 257.0 lb

## 2023-07-28 DIAGNOSIS — M150 Primary generalized (osteo)arthritis: Secondary | ICD-10-CM

## 2023-07-28 DIAGNOSIS — E782 Mixed hyperlipidemia: Secondary | ICD-10-CM

## 2023-07-28 DIAGNOSIS — M21611 Bunion of right foot: Secondary | ICD-10-CM

## 2023-07-28 DIAGNOSIS — E1169 Type 2 diabetes mellitus with other specified complication: Secondary | ICD-10-CM | POA: Diagnosis not present

## 2023-07-28 DIAGNOSIS — I10 Essential (primary) hypertension: Secondary | ICD-10-CM

## 2023-07-28 DIAGNOSIS — Z7985 Long-term (current) use of injectable non-insulin antidiabetic drugs: Secondary | ICD-10-CM

## 2023-07-28 DIAGNOSIS — Z8249 Family history of ischemic heart disease and other diseases of the circulatory system: Secondary | ICD-10-CM | POA: Diagnosis not present

## 2023-07-28 DIAGNOSIS — Z125 Encounter for screening for malignant neoplasm of prostate: Secondary | ICD-10-CM

## 2023-07-28 DIAGNOSIS — Z0000 Encounter for general adult medical examination without abnormal findings: Secondary | ICD-10-CM

## 2023-07-28 LAB — POCT GLYCOSYLATED HEMOGLOBIN (HGB A1C): Hemoglobin A1C: 5.3 % (ref 4.0–5.6)

## 2023-07-28 MED ORDER — LOSARTAN POTASSIUM 100 MG PO TABS: 100.0000 mg | ORAL_TABLET | Freq: Every day | ORAL | 3 refills | Status: AC

## 2023-07-28 MED ORDER — MOUNJARO 15 MG/0.5ML ~~LOC~~ SOAJ
15.0000 mg | SUBCUTANEOUS | 1 refills | Status: DC
Start: 2023-07-28 — End: 2023-12-01

## 2023-07-28 NOTE — Progress Notes (Unsigned)
 Subjective:    Patient ID: Michael Taylor, male    DOB: [birth year], 53 y.o.   MRN: [id number]  Michael Taylor is a 53 y.o. male presenting on 07/28/2023 for No chief complaint on file.   HPI  Discussed the use of AI scribe software for clinical note transcription with the patient, who gave verbal consent to proceed.  History of Present Illness         CHRONIC HTN / Morbid Obesity BMI >40   Home BP 120-130s/80s Consider future PSG not endorsing significant OSA symptoms now however,   See prior notes for background information. Improved BP. Off Amlodipine Seems BP improved with pain in hip resolved Current Meds - Losartan 100mg  daily Adhering to medication better now Tolerating well, w/o complaints. 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 Dramatic improvement after replacement Mobility improved Shoulders still a bit weak, he is working on R triceps muscle and strength.   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 Improved LDL 74 Not on statin    Health Maintenance:  ***      07/28/2023    1:28 PM 09/22/2022    4:11 PM 10/16/2021    5:27 PM  Depression screen PHQ 2/9  Decreased Interest 0 0 0  Down, Depressed, Hopeless 0 0 0  PHQ - 2 Score 0 0 0  Altered sleeping 0    Tired, decreased energy 0    Change in appetite 0    Feeling bad or failure about yourself  0    Trouble concentrating 0    Moving slowly or fidgety/restless 0    Suicidal  thoughts 0    PHQ-9 Score 0         07/28/2023    1:28 PM 09/22/2022    4:11 PM 10/16/2020    2:46 PM  GAD 7 : Generalized Anxiety Score  Nervous, Anxious, on Edge 0 0 0  Control/stop worrying 0 0 0  Worry too much - different things 0 0 0  Trouble relaxing 0 0 0  Restless 0 0 0  Easily annoyed or irritable 0 0 0  Afraid - awful might happen 0 0 0  Total GAD 7 Score 0 0 0  Anxiety Difficulty   Not difficult at all    Social History   Tobacco Use   Smoking status: Never   Smokeless tobacco: Never  Vaping Use   Vaping status: Never Used  Substance Use Topics   Alcohol use: Yes    Alcohol/week: 4.0 standard drinks of alcohol    Types: 4 Shots of liquor per week    Comment: rare, < 1 x monthly   Drug use: No    Review of Systems Per HPI unless specifically indicated above     Objective:    BP 124/80   Pulse 82   Ht 5' 6.73" (1.695 m)   Wt 257 lb (116.6 kg)   SpO2 97%   BMI 40.58 kg/m  Wt Readings from Last 3 Encounters:  07/28/23 257 lb (116.6 kg)  01/20/23 258 lb (117 kg)  09/22/22 260 lb (117.9 kg)    Physical Exam  Results for orders placed or performed in visit on 07/28/23  POCT HgB A1C   Collection Time: 07/28/23  1:34 PM  Result Value Ref Range   Hemoglobin A1C 5.3 4.0 - 5.6 %   HbA1c POC (<> result, manual entry)     HbA1c, POC (prediabetic range)     HbA1c, POC (controlled diabetic range)        Assessment & Plan:   Problem List Items Addressed This Visit     Essential hypertension   Relevant Medications   losartan (COZAAR) 100 MG tablet   Other Relevant Orders   CT CARDIAC SCORING (SELF PAY ONLY)   Type 2 diabetes mellitus with other specified complication (HCC) - Primary   Relevant Medications   MOUNJARO 15 MG/0.5ML Pen   losartan (COZAAR) 100 MG tablet   Other Relevant Orders   POCT HgB A1C (Completed)   CT CARDIAC SCORING (SELF PAY ONLY)   Other Visit Diagnoses       Family history of heart disease       Relevant Orders   CT  CARDIAC SCORING (SELF PAY ONLY)        Assessment and Plan             Orders Placed This Encounter  Procedures   CT CARDIAC SCORING (SELF PAY ONLY)    Standing Status:   Future    Expiration Date:   07/27/2024    Preferred imaging location?:   [facility]   POCT HgB A1C    Meds ordered this encounter  Medications   MOUNJARO 15 MG/0.5ML Pen    Sig: Inject 15 mg into the skin once a week.    Dispense:  6 mL    Refill:  1    Dose increase 12.5 to 15mg  weekly   losartan (COZAAR) 100 MG tablet    Sig: Take 1 tablet (100 mg total) by mouth daily.    Dispense:  90 tablet    Refill:  3    Add refills    Follow up plan: Return for 6 month fasting lab > 1 week later Annual Physical.  Future labs ordered for ***   Saralyn Pilar, DO [facility] 07/28/2023, 1:37 PM

## 2023-07-28 NOTE — Patient Instructions (Addendum)
 Thank you for coming to the office today.  Recent Labs    09/22/22 1634 12/24/22 0812 07/28/23 1334  HGBA1C 6.0* 5.9* 5.3   Dose increase to Mounjaro 15mg  weekly, rx 3 month sent   [facility]   Address: [address] [place], [place] [address] Phone: [phone]  Website: [email or web address]  Shingrix vaccine at pharmacy 2 doses 2-6 months apart  Try the toe spacers for the Right foot, I don't think ingrown nail We can consider Podiatry in future  You have been referred for a Coronary Calcium Score Cardiac CT Scan. This is a screening test for patients aged 44-50+ with cardiovascular risk factors or who are healthy but would be interested in Cardiovascular Screening for heart disease. Even if there is a family history of heart disease, this imaging can be useful. Typically it can be done every 5+ years or at a different timeline we agree on  The scan will look at the chest and mainly focus on the heart and identify early signs of calcium build up or blockages within the heart arteries. It is not 100% accurate for identifying blockages or heart disease, but it is useful to help [place] predict who may have some early changes or be at risk in the future for a heart attack or cardiovascular problem.  The results are reviewed by a Cardiologist and they will document the results. It should become available on MyChart. Typically the results are divided into percentiles based on other patients of the same demographic and age. So it will compare your risk to others similar to you. If you have a higher score >99 or higher percentile >75%tile, it is recommended to consider Statin cholesterol therapy and or referral to Cardiologist. I will try to help explain your results and if we have questions we can contact the Cardiologist.  You will be contacted for scheduling. Usually it is done at any imaging facility through [facility], [facility] or [facility].  The  cost is $99 flat fee total and it does not go through insurance, so no authorization is required.    DUE for FASTING BLOOD WORK (no food or drink after midnight before the lab appointment, only water or coffee without cream/sugar on the morning of)  SCHEDULE "Lab Only" visit in the morning at the clinic for lab draw in 6 MONTHS   - Make sure Lab Only appointment is at about 1 week before your next appointment, so that results will be available  For Lab Results, once available within 2-3 days of blood draw, you can can log in to MyChart online to view your results and a brief explanation. Also, we can discuss results at next follow-up visit.   Please schedule a Follow-up Appointment to: Return for 6 month fasting lab > 1 week later Annual Physical.  If you have any other questions or concerns, please feel free to call the office or send a message through MyChart. You may also schedule an earlier appointment if necessary.  Additionally, you may be receiving a survey about your experience at our office within a few days to 1 week by e-mail or mail. We value your feedback.  Saralyn Pilar, DO [facility], [place]

## 2023-08-12 ENCOUNTER — Ambulatory Visit
Admission: RE | Admit: 2023-08-12 | Discharge: 2023-08-12 | Disposition: A | Discharge status: Home / Self Care | Payer: Self-pay | Source: Ambulatory Visit | Attending: Family Medicine | Admitting: Family Medicine

## 2023-08-12 DIAGNOSIS — E1169 Type 2 diabetes mellitus with other specified complication: Secondary | ICD-10-CM | POA: Insufficient documentation

## 2023-08-12 DIAGNOSIS — Z8249 Family history of ischemic heart disease and other diseases of the circulatory system: Secondary | ICD-10-CM | POA: Insufficient documentation

## 2023-08-12 DIAGNOSIS — I10 Essential (primary) hypertension: Secondary | ICD-10-CM | POA: Insufficient documentation

## 2023-08-13 ENCOUNTER — Encounter: Payer: Self-pay | Admitting: Family Medicine

## 2023-09-29 ENCOUNTER — Other Ambulatory Visit: Payer: Self-pay | Admitting: Family Medicine

## 2023-09-29 DIAGNOSIS — M150 Primary generalized (osteo)arthritis: Secondary | ICD-10-CM

## 2023-09-29 DIAGNOSIS — M4802 Spinal stenosis, cervical region: Secondary | ICD-10-CM

## 2023-09-30 NOTE — Telephone Encounter (Signed)
 Requested Prescriptions  Pending Prescriptions Disp Refills   meloxicam  (MOBIC ) 15 MG tablet [Pharmacy Med Name: MELOXICAM  15 MG TABLET] 90 tablet 0    Sig: TAKE 1 TABLET BY MOUTH EVERY DAY AS NEEDED FOR PAIN     Analgesics:  COX2 Inhibitors Failed - 09/30/2023  2:33 PM      Failed - Manual Review: Labs are only required if the patient has taken medication for more than 8 weeks.      Passed - HGB in normal range and within 360 days    Hemoglobin  Date Value Ref Range Status  12/24/2022 14.2 13.2 - 17.1 g/dL Final         Passed - Cr in normal range and within 360 days    Creat  Date Value Ref Range Status  12/24/2022 0.95 0.70 - 1.30 mg/dL Final   Creatinine, Urine  Date Value Ref Range Status  12/24/2022 169 20 - 320 mg/dL Final         Passed - HCT in normal range and within 360 days    HCT  Date Value Ref Range Status  12/24/2022 44.8 38.5 - 50.0 % Final         Passed - AST in normal range and within 360 days    AST  Date Value Ref Range Status  12/24/2022 23 10 - 35 U/L Final         Passed - ALT in normal range and within 360 days    ALT  Date Value Ref Range Status  12/24/2022 25 9 - 46 U/L Final         Passed - eGFR is 30 or above and within 360 days    GFR, Est African American  Date Value Ref Range Status  10/10/2020 109 > OR = 60 mL/min/1.27m2 Final   GFR, Est Non African American  Date Value Ref Range Status  10/10/2020 94 > OR = 60 mL/min/1.59m2 Final   GFR, Estimated  Date Value Ref Range Status  01/06/2022 >60 >60 mL/min Final    Comment:    (NOTE) Calculated using the CKD-EPI Creatinine Equation (2021)    GFR  Date Value Ref Range Status  10/03/2011 94.25 >60.00 mL/min Final   eGFR  Date Value Ref Range Status  12/24/2022 95 > OR = 60 mL/min/1.59m2 Final         Passed - Patient is not pregnant      Passed - Valid encounter within last 12 months    Recent Outpatient Visits           2 months ago Type 2 diabetes mellitus with  other specified complication, without long-term current use of insulin [facility])   [place] [facility] [place], [place], [place]

## 2023-12-01 ENCOUNTER — Other Ambulatory Visit: Payer: Self-pay | Admitting: Family Medicine

## 2023-12-01 DIAGNOSIS — E1169 Type 2 diabetes mellitus with other specified complication: Secondary | ICD-10-CM

## 2023-12-01 NOTE — Telephone Encounter (Unsigned)
 Copied from CRM [id number]. Topic: Clinical - Medication Refill >> Dec 01, 2023  8:09 AM Carlatta H wrote: Medication: MOUNJARO  15 MG/0.5ML Pen  Has the patient contacted their pharmacy? No (Agent: If no, request that the patient contact the pharmacy for the refill. If patient does not wish to contact the pharmacy document the reason why and proceed with request.) (Agent: If yes, when and what did the pharmacy advise?)  This is the patient's preferred pharmacy:  [facility] [phone] [address] [place], [place] - [address] [place] [address] Phone: [phone] Fax: [phone]  Is this the correct pharmacy for this prescription? Yes If no, delete pharmacy and type the correct one.   Has the prescription been filled recently? No  Is the patient out of the medication? Yes  Has the patient been seen for an appointment in the last year OR does the patient have an upcoming appointment? Yes  Can we respond through MyChart? No  Agent: Please be advised that Rx refills may take up to 3 business days. We ask that you follow-up with your pharmacy.

## 2023-12-03 MED ORDER — MOUNJARO 15 MG/0.5ML ~~LOC~~ SOAJ: 15.0000 mg | SUBCUTANEOUS | 1 refills | Status: DC

## 2023-12-03 NOTE — Telephone Encounter (Signed)
 Requested medication (s) are due for refill today: yes  Requested medication (s) are on the active medication list: yes  Last refill:  07/28/23 6 ml 1 RF  Future visit scheduled: yes  Notes to clinic:  med not assigned to a protocol   Requested Prescriptions  Pending Prescriptions Disp Refills   MOUNJARO  15 MG/0.5ML Pen 6 mL 1    Sig: Inject 15 mg into the skin once a week.     Off-Protocol Failed - 12/03/2023 10:35 AM      Failed - Medication not assigned to a protocol, review manually.      Passed - Valid encounter within last 12 months    Recent Outpatient Visits           4 months ago Type 2 diabetes mellitus with other specified complication, without long-term current use of insulin [facility])   [facility] [place], Marsa PARAS, [place]

## 2023-12-29 ENCOUNTER — Other Ambulatory Visit: Payer: Self-pay | Admitting: Family Medicine

## 2023-12-29 DIAGNOSIS — M4802 Spinal stenosis, cervical region: Secondary | ICD-10-CM

## 2023-12-29 DIAGNOSIS — M150 Primary generalized (osteo)arthritis: Secondary | ICD-10-CM

## 2023-12-30 NOTE — Telephone Encounter (Signed)
 Requested medication (s) are due for refill today: yes  Requested medication (s) are on the active medication list: yes  Last refill:  09/30/23  Future visit scheduled: yes  Notes to clinic:  Unable to refill per protocol due to failed labs, no updated results.      Requested Prescriptions  Pending Prescriptions Disp Refills   meloxicam  (MOBIC ) 15 MG tablet [Pharmacy Med Name: MELOXICAM  15 MG TABLET] 90 tablet 0    Sig: TAKE 1 TABLET BY MOUTH EVERY DAY AS NEEDED FOR PAIN     Analgesics:  COX2 Inhibitors Failed - 12/30/2023  3:01 PM      Failed - Manual Review: Labs are only required if the patient has taken medication for more than 8 weeks.      Failed - HGB in normal range and within 360 days    Hemoglobin  Date Value Ref Range Status  12/24/2022 14.2 13.2 - 17.1 g/dL Final         Failed - Cr in normal range and within 360 days    Creat  Date Value Ref Range Status  12/24/2022 0.95 0.70 - 1.30 mg/dL Final   Creatinine, Urine  Date Value Ref Range Status  12/24/2022 169 20 - 320 mg/dL Final         Failed - HCT in normal range and within 360 days    HCT  Date Value Ref Range Status  12/24/2022 44.8 38.5 - 50.0 % Final         Failed - AST in normal range and within 360 days    AST  Date Value Ref Range Status  12/24/2022 23 10 - 35 U/L Final         Failed - ALT in normal range and within 360 days    ALT  Date Value Ref Range Status  12/24/2022 25 9 - 46 U/L Final         Failed - eGFR is 30 or above and within 360 days    GFR, Est African American  Date Value Ref Range Status  10/10/2020 109 > OR = 60 mL/min/1.28m2 Final   GFR, Est Non African American  Date Value Ref Range Status  10/10/2020 94 > OR = 60 mL/min/1.79m2 Final   GFR, Estimated  Date Value Ref Range Status  01/06/2022 >60 >60 mL/min Final    Comment:    (NOTE) Calculated using the CKD-EPI Creatinine Equation (2021)    GFR  Date Value Ref Range Status  10/03/2011 94.25 >60.00  mL/min Final   eGFR  Date Value Ref Range Status  12/24/2022 95 > OR = 60 mL/min/1.36m2 Final         Passed - Patient is not pregnant      Passed - Valid encounter within last 12 months    Recent Outpatient Visits           5 months ago Type 2 diabetes mellitus with other specified complication, without long-term current use of insulin [facility])   [facility] [place], Marsa PARAS, [place]

## 2024-01-28 ENCOUNTER — Other Ambulatory Visit

## 2024-01-28 DIAGNOSIS — E1169 Type 2 diabetes mellitus with other specified complication: Secondary | ICD-10-CM

## 2024-01-28 DIAGNOSIS — I10 Essential (primary) hypertension: Secondary | ICD-10-CM

## 2024-01-28 DIAGNOSIS — Z125 Encounter for screening for malignant neoplasm of prostate: Secondary | ICD-10-CM

## 2024-01-28 DIAGNOSIS — Z0000 Encounter for general adult medical examination without abnormal findings: Secondary | ICD-10-CM

## 2024-01-28 DIAGNOSIS — E782 Mixed hyperlipidemia: Secondary | ICD-10-CM

## 2024-01-29 LAB — CBC WITH DIFFERENTIAL/PLATELET
Absolute Lymphocytes: 1512 {cells}/uL (ref 850–3900)
Absolute Monocytes: 480 {cells}/uL (ref 200–950)
Basophils Absolute: 48 {cells}/uL (ref 0–200)
Basophils Relative: 0.8 %
Eosinophils Absolute: 162 {cells}/uL (ref 15–500)
Eosinophils Relative: 2.7 %
HCT: 49 % (ref 38.5–50.0)
Hemoglobin: 16.2 g/dL (ref 13.2–17.1)
MCH: 27.8 pg (ref 27.0–33.0)
MCHC: 33.1 g/dL (ref 32.0–36.0)
MCV: 84 fL (ref 80.0–100.0)
MPV: 11.2 fL (ref 7.5–12.5)
Monocytes Relative: 8 %
Neutro Abs: 3798 {cells}/uL (ref 1500–7800)
Neutrophils Relative %: 63.3 %
Platelets: 262 Thousand/uL (ref 140–400)
RBC: 5.83 Million/uL — ABNORMAL HIGH (ref 4.20–5.80)
RDW: 14.8 % (ref 11.0–15.0)
Total Lymphocyte: 25.2 %
WBC: 6 Thousand/uL (ref 3.8–10.8)

## 2024-01-29 LAB — HEMOGLOBIN A1C
Hgb A1c MFr Bld: 5.3 % (ref <5.7)
Mean Plasma Glucose: 105 mg/dL
eAG (mmol/L): 5.8 mmol/L

## 2024-01-29 LAB — COMPLETE METABOLIC PANEL WITHOUT GFR
AG Ratio: 1.9 (calc) (ref 1.0–2.5)
ALT: 36 U/L (ref 9–46)
AST: 26 U/L (ref 10–35)
Albumin: 4.5 g/dL (ref 3.6–5.1)
Alkaline phosphatase (APISO): 63 U/L (ref 35–144)
BUN: 13 mg/dL (ref 7–25)
CO2: 28 mmol/L (ref 20–32)
Calcium: 9.3 mg/dL (ref 8.6–10.3)
Chloride: 102 mmol/L (ref 98–110)
Creat: 0.96 mg/dL (ref 0.70–1.30)
Globulin: 2.4 g/dL (ref 1.9–3.7)
Glucose, Bld: 102 mg/dL — ABNORMAL HIGH (ref 65–99)
Potassium: 4.4 mmol/L (ref 3.5–5.3)
Sodium: 140 mmol/L (ref 135–146)
Total Bilirubin: 0.8 mg/dL (ref 0.2–1.2)
Total Protein: 6.9 g/dL (ref 6.1–8.1)

## 2024-01-29 LAB — LIPID PANEL
Cholesterol: 152 mg/dL (ref <200)
HDL: 40 mg/dL (ref >=40)
LDL Cholesterol (Calc): 82 mg/dL
Non-HDL Cholesterol (Calc): 112 mg/dL (ref <130)
Total CHOL/HDL Ratio: 3.8 (calc) (ref <5.0)
Triglycerides: 204 mg/dL — ABNORMAL HIGH (ref <150)

## 2024-01-29 LAB — MICROALBUMIN / CREATININE URINE RATIO
Creatinine, Urine: 210 mg/dL (ref 20–320)
Microalb Creat Ratio: 10 mg/g{creat} (ref <30)
Microalb, Ur: 2 mg/dL

## 2024-01-29 LAB — PSA: PSA: 1.29 ng/mL (ref <=4.00)

## 2024-01-29 LAB — TSH: TSH: 1.53 m[IU]/L (ref 0.40–4.50)

## 2024-02-08 ENCOUNTER — Ambulatory Visit (INDEPENDENT_AMBULATORY_CARE_PROVIDER_SITE_OTHER): Admitting: Family Medicine

## 2024-02-08 ENCOUNTER — Encounter: Payer: Self-pay | Admitting: Family Medicine

## 2024-02-08 ENCOUNTER — Other Ambulatory Visit: Payer: Self-pay | Admitting: Family Medicine

## 2024-02-08 VITALS — BP 134/84 | HR 82 | Ht 66.0 in | Wt 249.8 lb

## 2024-02-08 DIAGNOSIS — M19041 Primary osteoarthritis, right hand: Secondary | ICD-10-CM

## 2024-02-08 DIAGNOSIS — Z0000 Encounter for general adult medical examination without abnormal findings: Secondary | ICD-10-CM | POA: Diagnosis not present

## 2024-02-08 DIAGNOSIS — M19042 Primary osteoarthritis, left hand: Secondary | ICD-10-CM

## 2024-02-08 DIAGNOSIS — I10 Essential (primary) hypertension: Secondary | ICD-10-CM

## 2024-02-08 NOTE — Patient Instructions (Addendum)
 Thank you for coming to the office today.  Reviewed labs  Great work overall  Recent Labs    07/28/23 1334 01/28/24 0757  HGBA1C 5.3 5.3   Prevnar-20 pneumonia vaccine next year.  Your provider would like to you have your annual eye exam. Please contact your current eye doctor or here are some good options for you to contact.   [facility]   Address: [address] [place], [place] [address] Phone: [phone]  Website: [email or web address]     Please schedule a Follow-up Appointment to: Return in about 6 months (around 08/07/2024) for 6 month DM A1c, then lab PSA after.  If you have any other questions or concerns, please feel free to call the office or send a message through MyChart. You may also schedule an earlier appointment if necessary.  Additionally, you may be receiving a survey about your experience at our office within a few days to 1 week by e-mail or mail. We value your feedback.  Marsa Officer, DO [facility], [place]

## 2024-02-08 NOTE — Progress Notes (Signed)
 Subjective:    Patient ID: Michael Taylor, male    DOB: [birth year], 53 y.o.   MRN: [id number]  Michael Taylor is a 53 y.o. male presenting on 02/08/2024 for Annual Exam   HPI  Discussed the use of AI scribe software for clinical note transcription with the patient, who gave verbal consent to proceed.  History of Present Illness   Michael Taylor is a 53 year old male who presents for an annual physical exam.  Peripheral neuropathy and post-surgical pain - Numbness and pain in right hand, especially fingers, following spinal surgery for Cervical Spine Neck - Persistent nerve symptoms despite surgery  Right Foot Deformity R Great toe angled toward 2nd toe with bunion vs arthritis deformity R great toe - Uses gel spacer for pressure point and swelling management in right hand  Osteoarthritis Right Hand History of arthritis in multiple joints Has inc bulkiness swelling on R side vs Left  - Uses meloxicam  as needed for joint pain  Hypertension management - Blood pressure managed with losartan , recently refilled        CHRONIC HTN / Morbid Obesity BMI >40  Home BP controlled Current Meds - Losartan  100mg  daily Tolerating well, w/o complaints. Denies CP, dyspnea, HA, edema, dizziness / lightheadedness   Type 2 Diabetes Stable A1c 5.3, from previous 5.3 Off Metformin  Currently on Mounjaro  15mg  weekly Admits constipation side effect - Weight loss to 249 pounds from 257 pounds - No recent eye exam for diabetic retinopathy monitoring Denies hypoglycemia, polyuria, visual changes, numbness or tingling.   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 Dramatic improvement after replacement Mobility improved   HYPERLIPIDEMIA: - Family history of hypertriglyceridemia (father) - LDL cholesterol 82, stable from last year 63 - Not currently on cholesterol-lowering medication   Health Maintenance:  Cologuard 02/09/23  negative repeat 3 years 2027  PSA 1.29 up from 0.94 - Family history of prostate cancer (father treated with radiation in his 39s) - No major lower urinary tract symptoms  - Considering influenza, pneumococcal, and shingles vaccinations     07/28/2023    1:28 PM 09/22/2022    4:11 PM 10/16/2021    5:27 PM  Depression screen PHQ 2/9  Decreased Interest 0 0 0  Down, Depressed, Hopeless 0 0 0  PHQ - 2 Score 0 0 0  Altered sleeping 0    Tired, decreased energy 0    Change in appetite 0    Feeling bad or failure about yourself  0    Trouble concentrating 0    Moving slowly or fidgety/restless 0    Suicidal thoughts 0    PHQ-9 Score 0         07/28/2023    1:28 PM 09/22/2022    4:11 PM 10/16/2020    2:46 PM  GAD 7 : Generalized Anxiety Score  Nervous, Anxious, on Edge 0 0 0  Control/stop worrying 0 0 0  Worry too much - different things 0 0 0  Trouble relaxing 0 0 0  Restless 0 0 0  Easily annoyed or irritable 0 0 0  Afraid - awful might happen 0 0 0  Total GAD 7 Score 0 0 0  Anxiety Difficulty   Not difficult at all     Past Medical History:  Diagnosis Date   Arthritis    Dysplastic nevus 04/26/2015   Left sup. med. scapula. Moderate atypia, margins free.   Dysplastic  nevus 04/26/2015   Low back spinal. Mild atypia, deep margin involved.   Dysplastic nevus 04/26/2015   Left posterior side. Mild atypia, lateral margin involved.   Dysplastic nevus 04/26/2015   Left lateral waistline. Mild atypia, lateral and deep margin involved.    Dysplastic nevus 04/27/2019   Right post. shoulder. Moderate atypia, close to margin   Dysplastic nevus 04/27/2019   Left low back paraspinal. Moderate atypia, peripheral margin involved.   Dysplastic nevus 04/27/2019   Right low back paraspinal above sacral. Moderate atypia, margins free.    Hypertension    Neuromuscular disorder (HCC)    numbness right hand   Plantar fasciitis, bilateral    right greater that left,  diagnosed by Dr.   Ovidio   Pre-diabetes    Past Surgical History:  Procedure Laterality Date   ANTERIOR CERVICAL DECOMP/DISCECTOMY FUSION N/A 01/15/2022   Procedure: C5-7 ANTERIOR CERVICAL DISCECTOMY AND FUSION (GLOBUS HEDRON);  Surgeon: Clois Fret, MD;  Location: [facility] ORS;  Service: Neurosurgery;  Laterality: N/A;   CARPAL TUNNEL RELEASE Bilateral    COLONOSCOPY     HIP SURGERY     right,  [facility] bone graft   MECKEL DIVERTICULUM EXCISION  2011   Mark Bird   SHOULDER DEBRIDEMENT     x 2   WISDOM TOOTH EXTRACTION     Social History   Socioeconomic History   Marital status: Married    Spouse name: Mliss   Number of children: 4   Years of education: [employer] Advice worker)   Highest education level: Not on file  Occupational History   Occupation: Pensions consultant (Engineer, structural)    Comment: Practices locally in [place]  Tobacco Use   Smoking status: Never   Smokeless tobacco: Never  Vaping Use   Vaping status: Never Used  Substance and Sexual Activity   Alcohol use: Yes    Alcohol/week: 4.0 standard drinks of alcohol    Types: 4 Shots of liquor per week    Comment: rare, < 1 x monthly   Drug use: No   Sexual activity: Not on file  Other Topics Concern   Not on file  Social History Narrative   Not on file   Social Drivers of Health   Financial Resource Strain: Not on file  Food Insecurity: Not on file  Transportation Needs: Not on file  Physical Activity: Not on file  Stress: Not on file  Social Connections: Unknown (09/17/2021)   [facility] from [facility]   Social Network    Social Network: Not on file  Intimate Partner Violence: Unknown (09/17/2021)   Received from [facility]   HITS    Physically Hurt: Not on file    Insult or Talk Down To: Not on file    Threaten Physical Harm: Not on file    Scream or Curse: Not on file   Family History  Problem Relation Age of Onset   Arthritis Mother    Hypertension Mother    Arthritis Father        knee replacement   Hypertension Father    Heart  disease Father 70   Heart attack Father 55   Prostate cancer Father 52       radiation treatment successful   Colon cancer Neg Hx    Current Outpatient Medications on File Prior to Visit  Medication Sig   loratadine (CLARITIN) 10 MG tablet Take 10 mg by mouth daily. (Patient taking differently: Take 10 mg by mouth as needed.)   losartan  (COZAAR )  100 MG tablet Take 1 tablet (100 mg total) by mouth daily.   meloxicam  (MOBIC ) 15 MG tablet TAKE 1 TABLET BY MOUTH EVERY DAY AS NEEDED FOR PAIN   MOUNJARO  15 MG/0.5ML Pen Inject 15 mg into the skin once a week.   Multiple Vitamin (MULTIVITAMIN) tablet Take 1 tablet by mouth daily.   omeprazole  (PRILOSEC) 20 MG capsule Take 1 capsule (20 mg total) by mouth daily.   vitamin B-12 (CYANOCOBALAMIN) 100 MCG tablet Take 100 mcg by mouth daily.   No current facility-administered medications on file prior to visit.    Review of Systems  Constitutional:  Negative for activity change, appetite change, chills, diaphoresis, fatigue and fever.  HENT:  Negative for congestion and hearing loss.   Eyes:  Negative for visual disturbance.  Respiratory:  Negative for cough, chest tightness, shortness of breath and wheezing.   Cardiovascular:  Negative for chest pain, palpitations and leg swelling.  Gastrointestinal:  Negative for abdominal pain, constipation, diarrhea, nausea and vomiting.  Genitourinary:  Negative for dysuria, frequency and hematuria.  Musculoskeletal:  Negative for arthralgias and neck pain.  Skin:  Negative for rash.  Neurological:  Negative for dizziness, weakness, light-headedness, numbness and headaches.  Hematological:  Negative for adenopathy.  Psychiatric/Behavioral:  Negative for behavioral problems, dysphoric mood and sleep disturbance.    Per HPI unless specifically indicated above     Objective:    BP 134/84 (BP Location: Right Arm, Patient Position: Sitting, Cuff Size: Large)   Pulse 82   Ht 5' 6 (1.676 m)   Wt 249 lb 12.8  oz (113.3 kg)   SpO2 97%   BMI 40.32 kg/m   Wt Readings from Last 3 Encounters:  02/08/24 249 lb 12.8 oz (113.3 kg)  07/28/23 257 lb (116.6 kg)  01/20/23 258 lb (117 kg)    Physical Exam Vitals and nursing note reviewed.  Constitutional:      General: He is not in acute distress.    Appearance: He is well-developed. He is not diaphoretic.     Comments: Well-appearing, comfortable, cooperative  HENT:     Head: Normocephalic and atraumatic.  Eyes:     General:        Right eye: No discharge.        Left eye: No discharge.     Conjunctiva/sclera: Conjunctivae normal.     Pupils: Pupils are equal, round, and reactive to light.  Neck:     Thyroid: No thyromegaly.  Cardiovascular:     Rate and Rhythm: Normal rate and regular rhythm.     Pulses: Normal pulses.     Heart sounds: Normal heart sounds. No murmur heard. Pulmonary:     Effort: Pulmonary effort is normal. No respiratory distress.     Breath sounds: Normal breath sounds. No wheezing or rales.  Abdominal:     General: Bowel sounds are normal. There is no distension.     Palpations: Abdomen is soft. There is no mass.     Tenderness: There is no abdominal tenderness.  Musculoskeletal:        General: No tenderness. Normal range of motion.     Cervical back: Normal range of motion and neck supple.     Comments: Right hand with some slight bulkier appearance and fullness without obvious edema. Focal areas R hand index finger MCP and DIP  Upper / Lower Extremities: - Normal muscle tone, strength bilateral upper extremities 5/5, lower extremities 5/5  Lymphadenopathy:     Cervical: No cervical  adenopathy.  Skin:    General: Skin is warm and dry.     Findings: No erythema or rash.  Neurological:     Mental Status: He is alert and oriented to person, place, and time.     Comments: Distal sensation intact to light touch all extremities  Psychiatric:        Mood and Affect: Mood normal.        Behavior: Behavior normal.         Thought Content: Thought content normal.     Comments: Well groomed, good eye contact, normal speech and thoughts     Diabetic Foot Exam - Simple   Simple Foot Form Visual Inspection See comments: Yes Sensation Testing See comments: Yes Pulse Check Posterior Tibialis and Dorsalis pulse intact bilaterally: Yes Comments Bilateral mild reduced monofilament sensation bilateral heel hindfoot region. Intact forefoot and midfoot. Slight reduced great toe sensation tip bilateral. No ulceration. Angled R great toe joint lateral against 2nd toe.     ADDENDUM REPORT: 08/14/2023 11:40   ADDENDUM: OVER-READ INTERPRETATION  CT CHEST   The following report is an over-read performed by radiologist Dr. Franky Puffer [facility] on 07/31/2023. This over-read does not include interpretation of cardiac or coronary anatomy or pathology. The interpretation by the cardiologist is attached.   COMPARISON:  None.   FINDINGS: Within the visualized portions of the lungs demonstrates no acute infiltrates consolidations. No pulmonary nodules or masses.   No pleural effusions.   No mediastinal masses or adenopathy. No pericardial effusions.   Aorta appears normal caliber.   No acute findings in the upper abdomen.   Chest wall and soft tissues are unremarkable without evidence of acute bony abnormalities.   Coronary artery calcifications   IMPRESSION: Unremarkable CT without evidence of pulmonary nodules or masses.     Electronically Signed   By: Franky Chard M.D.   On: 08/14/2023 11:40    Addended by Chard Franky, MD on 08/14/2023 11:43 AM    Study Result  Narrative & Impression  CLINICAL DATA:  Risk stratification   EXAM: Coronary Calcium Score   TECHNIQUE: The patient was scanned on a Siemens Somatom scanner. Axial non-contrast 3 mm slices were carried out through the heart. The data set was analyzed on a dedicated work station and scored using the Agatson  method.   FINDINGS: Non-cardiac: See separate report from [facility].   Ascending Aorta: Normal size   Pericardium: Normal   Coronary arteries: Normal origin of left and right coronary arteries. Distribution of arterial calcifications if present, as noted below;   LM 0   LAD 91.2   LCx 0   RCA 10.4   Total 102   IMPRESSION AND RECOMMENDATION: 1. Coronary calcium score of 102. This was 82nd percentile for age and sex matched control.   2. CAC 100-299 in LAD, RCA. CAC-DRS A2/N2.   3. Recommend aspirin and statin if no contraindication.   4. Continue heart healthy lifestyle and risk factor modification.   Electronically Signed: By: Redell Cave M.D. On: 08/13/2023 17:18     Results for orders placed or performed in visit on 01/28/24  TSH   Collection Time: 01/28/24  7:57 AM  Result Value Ref Range   TSH 1.53 0.40 - 4.50 mIU/L  Microalbumin / creatinine urine ratio   Collection Time: 01/28/24  7:57 AM  Result Value Ref Range   Creatinine, Urine 210 20 - 320 mg/dL   Microalb, Ur 2.0 mg/dL  Microalb Creat Ratio 10 <30 mg/g creat  PSA   Collection Time: 01/28/24  7:57 AM  Result Value Ref Range   PSA 1.29 < OR = 4.00 ng/mL  CBC with Differential/Platelet   Collection Time: 01/28/24  7:57 AM  Result Value Ref Range   WBC 6.0 3.8 - 10.8 Thousand/uL   RBC 5.83 (H) 4.20 - 5.80 Million/uL   Hemoglobin 16.2 13.2 - 17.1 g/dL   HCT 50.9 61.4 - 49.9 %   MCV 84.0 80.0 - 100.0 fL   MCH 27.8 27.0 - 33.0 pg   MCHC 33.1 32.0 - 36.0 g/dL   RDW 85.1 88.9 - 84.9 %   Platelets 262 140 - 400 Thousand/uL   MPV 11.2 7.5 - 12.5 fL   Neutro Abs 3,798 1,500 - 7,800 cells/uL   Absolute Lymphocytes 1,512 850 - 3,900 cells/uL   Absolute Monocytes 480 200 - 950 cells/uL   Eosinophils Absolute 162 15 - 500 cells/uL   Basophils Absolute 48 0 - 200 cells/uL   Neutrophils Relative % 63.3 %   Total Lymphocyte 25.2 %   Monocytes Relative 8.0 %   Eosinophils Relative  2.7 %   Basophils Relative 0.8 %  COMPLETE METABOLIC PANEL WITH GFR   Collection Time: 01/28/24  7:57 AM  Result Value Ref Range   Glucose, Bld 102 (H) 65 - 99 mg/dL   BUN 13 7 - 25 mg/dL   Creat 9.03 9.29 - 8.69 mg/dL   BUN/Creatinine Ratio SEE NOTE: 6 - 22 (calc)   Sodium 140 135 - 146 mmol/L   Potassium 4.4 3.5 - 5.3 mmol/L   Chloride 102 98 - 110 mmol/L   CO2 28 20 - 32 mmol/L   Calcium 9.3 8.6 - 10.3 mg/dL   Total Protein 6.9 6.1 - 8.1 g/dL   Albumin 4.5 3.6 - 5.1 g/dL   Globulin 2.4 1.9 - 3.7 g/dL (calc)   AG Ratio 1.9 1.0 - 2.5 (calc)   Total Bilirubin 0.8 0.2 - 1.2 mg/dL   Alkaline phosphatase (APISO) 63 35 - 144 U/L   AST 26 10 - 35 U/L   ALT 36 9 - 46 U/L  Hemoglobin A1c   Collection Time: 01/28/24  7:57 AM  Result Value Ref Range   Hgb A1c MFr Bld 5.3 <5.7 %   Mean Plasma Glucose 105 mg/dL   eAG (mmol/L) 5.8 mmol/L  Lipid panel   Collection Time: 01/28/24  7:57 AM  Result Value Ref Range   Cholesterol 152 <200 mg/dL   HDL 40 > OR = 40 mg/dL   Triglycerides 795 (H) <150 mg/dL   LDL Cholesterol (Calc) 82 mg/dL (calc)   Total CHOL/HDL Ratio 3.8 <5.0 (calc)   Non-HDL Cholesterol (Calc) 112 <130 mg/dL (calc)      Assessment & Plan:   Problem List Items Addressed This Visit     Essential hypertension   Other Visit Diagnoses       Annual physical exam    -  Primary        Updated Health Maintenance information Reviewed recent lab results with patient Encouraged improvement to lifestyle with diet and exercise Goal of weight loss   Type 2 Diabetes Mellitus with Diabetic Neuropathy Diabetes well-controlled with A1c at 5.3. Neuropathy present with reduced sensation in feet. - Continue Mounjaro  15mg  injections. - Increase dietary fiber to manage constipation. - Schedule diabetic [facility] at [facility]. - Monitor foot health and sensation.  Hypertension Blood pressure well-controlled at 134/84 mmHg due to  weight loss and lifestyle changes. - Continue  current antihypertensive regimen. Losartan  100mg  daily - Monitor blood pressure regularly.  Hyperlipidemia LDL cholesterol well-controlled under 100 mg/dL. Triglycerides slightly elevated. Not on Statin therapy at this time.  Morbid Obesity BMI >40 Comorbid conditions with Hypertension, Diabetes Type 2, Hyperlipidemia Weight decreased from 257 lbs to 249 lbs. Aims for further weight loss. - Continue current weight management plan GLP1 and lifestyle modifications - Aim for gradual weight loss of 1-2 lbs per month.  Right Hand Neuropathy and Osteoarthritis 80% numbness in certain fingers with puffiness. Previous spinal surgery did not resolve symptoms. - Order bilateral hand x-rays for joint assessment. - Consider orthopedist referral if needed.  Bilateral Foot Osteoarthritis Mild structural changes with reduced sensation. No significant ulcerations or worsening symptoms. - Continue using toe spacers as needed. - Monitor foot health regularly.  Benign Prostatic Hyperplasia PSA increased to 1.29. Family history of prostate cancer noted with father. - Consider PSA recheck in six months. Given gradual increase but only approx 0.3-0.4 increase in 1 year, which is below threshold for further surveillance. - Monitor for any urinary symptoms.  Adult Wellness Visit Annual wellness visit conducted. Reviewed general health maintenance and lab results. - Schedule six-month follow-up visit for Tuesday afternoon. - Continue current medications and lifestyle modifications.  General Health Maintenance Discussed vaccinations including flu, pneumonia, and shingles. Pneumonia vaccine recommended for age 23+. - Consider pneumonia vaccination (Prevnar 20). - Schedule eye exam for diabetic retinopathy screening.       Future Bilateral hand x-rays, eval for osteoarthritis   No orders of the defined types were placed in this encounter.   No orders of the defined types were placed in this  encounter.    Follow up plan: Return in about 6 months (around 08/07/2024) for 6 month DM A1c, then lab PSA after.  consider adding PSA in 6 months  Marsa Officer, DO [facility] 02/08/2024, 1:32 PM

## 2024-03-30 ENCOUNTER — Other Ambulatory Visit: Payer: Self-pay | Admitting: Family Medicine

## 2024-03-30 DIAGNOSIS — M150 Primary generalized (osteo)arthritis: Secondary | ICD-10-CM

## 2024-03-30 DIAGNOSIS — M4802 Spinal stenosis, cervical region: Secondary | ICD-10-CM

## 2024-04-01 NOTE — Telephone Encounter (Signed)
 Requested Prescriptions  Pending Prescriptions Disp Refills   meloxicam  (MOBIC ) 15 MG tablet [Pharmacy Med Name: MELOXICAM  15 MG TABLET] 90 tablet 1    Sig: TAKE 1 TABLET BY MOUTH EVERY DAY AS NEEDED FOR PAIN     Analgesics:  COX2 Inhibitors Failed - 04/01/2024 12:17 PM      Failed - Manual Review: Labs are only required if the patient has taken medication for more than 8 weeks.      Failed - eGFR is 30 or above and within 360 days    GFR, Est African American  Date Value Ref Range Status  10/10/2020 109 > OR = 60 mL/min/1.33m2 Final   GFR, Est Non African American  Date Value Ref Range Status  10/10/2020 94 > OR = 60 mL/min/1.31m2 Final   GFR, Estimated  Date Value Ref Range Status  01/06/2022 >60 >60 mL/min Final    Comment:    (NOTE) Calculated using the CKD-EPI Creatinine Equation (2021)    GFR  Date Value Ref Range Status  10/03/2011 94.25 >60.00 mL/min Final   eGFR  Date Value Ref Range Status  12/24/2022 95 > OR = 60 mL/min/1.59m2 Final         Passed - HGB in normal range and within 360 days    Hemoglobin  Date Value Ref Range Status  01/28/2024 16.2 13.2 - 17.1 g/dL Final         Passed - Cr in normal range and within 360 days    Creat  Date Value Ref Range Status  01/28/2024 0.96 0.70 - 1.30 mg/dL Final   Creatinine, Urine  Date Value Ref Range Status  01/28/2024 210 20 - 320 mg/dL Final         Passed - HCT in normal range and within 360 days    HCT  Date Value Ref Range Status  01/28/2024 49.0 38.5 - 50.0 % Final         Passed - AST in normal range and within 360 days    AST  Date Value Ref Range Status  01/28/2024 26 10 - 35 U/L Final         Passed - ALT in normal range and within 360 days    ALT  Date Value Ref Range Status  01/28/2024 36 9 - 46 U/L Final         Passed - Patient is not pregnant      Passed - Valid encounter within last 12 months    Recent Outpatient Visits           1 month ago Annual physical exam   [facility] [place] PARAS, [place]   8 months ago Type 2 diabetes mellitus with other specified complication, without long-term current use of insulin [facility])   [facility] [place], Marsa PARAS, [place]

## 2024-05-17 ENCOUNTER — Other Ambulatory Visit: Payer: Self-pay | Admitting: Family Medicine

## 2024-05-17 DIAGNOSIS — E1169 Type 2 diabetes mellitus with other specified complication: Secondary | ICD-10-CM

## 2024-05-18 NOTE — Telephone Encounter (Signed)
 Requested medication (s) are due for refill today: Yes  Requested medication (s) are on the active medication list: Yes  Last refill:  11/30/23  Future visit scheduled: Yes  Notes to clinic:  Manual review.    Requested Prescriptions  Pending Prescriptions Disp Refills   MOUNJARO  15 MG/0.5ML Pen [Pharmacy Med Name: MOUNJARO  15 MG/0.5 ML PEN]  1    Sig: Inject 15 mg into the skin once a week.     Off-Protocol Failed - 05/18/2024 12:21 PM      Failed - Medication not assigned to a protocol, review manually.      Passed - Valid encounter within last 12 months    Recent Outpatient Visits           3 months ago Annual physical exam   [facility] [place], Marsa PARAS, [place]   9 months ago Type 2 diabetes mellitus with other specified complication, without long-term current use of insulin [facility])   [facility] [place], Marsa PARAS, [place]

## 2024-08-09 ENCOUNTER — Ambulatory Visit: Admitting: Family Medicine
